# Patient Record
Sex: Male | Born: 1935 | Race: White | Hispanic: No | Marital: Married | Smoking: Former smoker
Health system: Southern US, Community
[De-identification: ages and names within clinical notes are randomized; demographics above are authoritative.]

## PROBLEM LIST (undated history)

## (undated) DIAGNOSIS — I2510 Atherosclerotic heart disease of native coronary artery without angina pectoris: Secondary | ICD-10-CM

## (undated) DIAGNOSIS — N289 Disorder of kidney and ureter, unspecified: Secondary | ICD-10-CM

## (undated) DIAGNOSIS — F0390 Unspecified dementia without behavioral disturbance: Secondary | ICD-10-CM

## (undated) DIAGNOSIS — E785 Hyperlipidemia, unspecified: Secondary | ICD-10-CM

## (undated) DIAGNOSIS — I10 Essential (primary) hypertension: Secondary | ICD-10-CM

## (undated) DIAGNOSIS — E119 Type 2 diabetes mellitus without complications: Secondary | ICD-10-CM

---

## 2019-08-12 ENCOUNTER — Emergency Department: Payer: Medicare Other

## 2019-08-12 ENCOUNTER — Emergency Department
Admission: EM | Admit: 2019-08-12 | Discharge: 2019-08-12 | Disposition: A | Discharge status: Skilled Nursing Facility | Payer: Medicare Other | Attending: Emergency Medicine | Admitting: Emergency Medicine

## 2019-08-12 ENCOUNTER — Other Ambulatory Visit: Payer: Self-pay

## 2019-08-12 DIAGNOSIS — N390 Urinary tract infection, site not specified: Secondary | ICD-10-CM | POA: Diagnosis not present

## 2019-08-12 DIAGNOSIS — E876 Hypokalemia: Secondary | ICD-10-CM | POA: Diagnosis not present

## 2019-08-12 DIAGNOSIS — I509 Heart failure, unspecified: Secondary | ICD-10-CM | POA: Insufficient documentation

## 2019-08-12 DIAGNOSIS — R404 Transient alteration of awareness: Secondary | ICD-10-CM | POA: Diagnosis not present

## 2019-08-12 DIAGNOSIS — E1122 Type 2 diabetes mellitus with diabetic chronic kidney disease: Secondary | ICD-10-CM | POA: Insufficient documentation

## 2019-08-12 DIAGNOSIS — G309 Alzheimer's disease, unspecified: Secondary | ICD-10-CM | POA: Insufficient documentation

## 2019-08-12 DIAGNOSIS — Z7984 Long term (current) use of oral hypoglycemic drugs: Secondary | ICD-10-CM | POA: Insufficient documentation

## 2019-08-12 DIAGNOSIS — I130 Hypertensive heart and chronic kidney disease with heart failure and stage 1 through stage 4 chronic kidney disease, or unspecified chronic kidney disease: Secondary | ICD-10-CM | POA: Diagnosis not present

## 2019-08-12 DIAGNOSIS — N189 Chronic kidney disease, unspecified: Secondary | ICD-10-CM | POA: Insufficient documentation

## 2019-08-12 DIAGNOSIS — I2510 Atherosclerotic heart disease of native coronary artery without angina pectoris: Secondary | ICD-10-CM | POA: Insufficient documentation

## 2019-08-12 DIAGNOSIS — R4182 Altered mental status, unspecified: Secondary | ICD-10-CM | POA: Diagnosis present

## 2019-08-12 HISTORY — DX: Unspecified dementia, unspecified severity, without behavioral disturbance, psychotic disturbance, mood disturbance, and anxiety: F03.90

## 2019-08-12 HISTORY — DX: Hyperlipidemia, unspecified: E78.5

## 2019-08-12 HISTORY — DX: Essential (primary) hypertension: I10

## 2019-08-12 HISTORY — DX: Atherosclerotic heart disease of native coronary artery without angina pectoris: I25.10

## 2019-08-12 HISTORY — DX: Type 2 diabetes mellitus without complications: E11.9

## 2019-08-12 HISTORY — DX: Disorder of kidney and ureter, unspecified: N28.9

## 2019-08-12 LAB — CBC WITH DIFFERENTIAL/PLATELET
Abs Immature Granulocytes: 0.05 10*3/uL (ref 0.00–0.07)
Basophils Absolute: 0.1 10*3/uL (ref 0.0–0.1)
Basophils Relative: 1 %
Eosinophils Absolute: 0.3 10*3/uL (ref 0.0–0.5)
Eosinophils Relative: 3 %
HCT: 39.8 % (ref 39.0–52.0)
Hemoglobin: 13.2 g/dL (ref 13.0–17.0)
Immature Granulocytes: 1 %
Lymphocytes Relative: 13 %
Lymphs Abs: 1.4 10*3/uL (ref 0.7–4.0)
MCH: 30.8 pg (ref 26.0–34.0)
MCHC: 33.2 g/dL (ref 30.0–36.0)
MCV: 92.8 fL (ref 80.0–100.0)
Monocytes Absolute: 1.1 10*3/uL — ABNORMAL HIGH (ref 0.1–1.0)
Monocytes Relative: 10 %
Neutro Abs: 8 10*3/uL — ABNORMAL HIGH (ref 1.7–7.7)
Neutrophils Relative %: 72 %
Platelets: 295 10*3/uL (ref 150–400)
RBC: 4.29 MIL/uL (ref 4.22–5.81)
RDW: 13.2 % (ref 11.5–15.5)
WBC: 10.8 10*3/uL — ABNORMAL HIGH (ref 4.0–10.5)
nRBC: 0 % (ref 0.0–0.2)

## 2019-08-12 LAB — TROPONIN I (HIGH SENSITIVITY)
Troponin I (High Sensitivity): 14 ng/L (ref <18)
Troponin I (High Sensitivity): 14 ng/L (ref <18)

## 2019-08-12 LAB — URINALYSIS, ROUTINE W REFLEX MICROSCOPIC
Bilirubin Urine: NEGATIVE
Glucose, UA: 50 mg/dL — AB
Hgb urine dipstick: NEGATIVE
Ketones, ur: NEGATIVE mg/dL
Nitrite: NEGATIVE
Protein, ur: 100 mg/dL — AB
Specific Gravity, Urine: 1.015 (ref 1.005–1.030)
pH: 5 (ref 5.0–8.0)

## 2019-08-12 LAB — COMPREHENSIVE METABOLIC PANEL
ALT: 19 U/L (ref 0–44)
AST: 26 U/L (ref 15–41)
Albumin: 3.9 g/dL (ref 3.5–5.0)
Alkaline Phosphatase: 45 U/L (ref 38–126)
Anion gap: 13 (ref 5–15)
BUN: 28 mg/dL — ABNORMAL HIGH (ref 8–23)
CO2: 21 mmol/L — ABNORMAL LOW (ref 22–32)
Calcium: 9.5 mg/dL (ref 8.9–10.3)
Chloride: 104 mmol/L (ref 98–111)
Creatinine, Ser: 1.71 mg/dL — ABNORMAL HIGH (ref 0.61–1.24)
GFR calc Af Amer: 40 mL/min — ABNORMAL LOW (ref >60)
GFR calc non Af Amer: 34 mL/min — ABNORMAL LOW (ref >60)
Glucose, Bld: 197 mg/dL — ABNORMAL HIGH (ref 70–99)
Potassium: 3.3 mmol/L — ABNORMAL LOW (ref 3.5–5.1)
Sodium: 138 mmol/L (ref 135–145)
Total Bilirubin: 0.6 mg/dL (ref 0.3–1.2)
Total Protein: 7.5 g/dL (ref 6.5–8.1)

## 2019-08-12 LAB — BRAIN NATRIURETIC PEPTIDE: B Natriuretic Peptide: 324 pg/mL — ABNORMAL HIGH (ref 0.0–100.0)

## 2019-08-12 MED ORDER — POTASSIUM CHLORIDE CRYS ER 20 MEQ PO TBCR
40.0000 meq | EXTENDED_RELEASE_TABLET | Freq: Once | ORAL | Status: AC
  Administered 2019-08-12: 40 meq via ORAL
  Filled 2019-08-12: qty 2

## 2019-08-12 MED ORDER — CIPROFLOXACIN HCL 500 MG PO TABS
500.0000 mg | ORAL_TABLET | Freq: Once | ORAL | Status: AC
  Administered 2019-08-12: 500 mg via ORAL
  Filled 2019-08-12: qty 1

## 2019-08-12 MED ORDER — CIPROFLOXACIN HCL 250 MG PO TABS: 250.0000 mg | ORAL_TABLET | Freq: Two times a day (BID) | ORAL | 0 refills | Status: DC

## 2019-08-12 NOTE — ED Provider Notes (Signed)
[facility] Emergency Department Provider Note  ____________________________________________   First MD Initiated Contact with Patient 08/12/19 1211     (approximate)  I have reviewed the triage vital signs and the nursing notes.   HISTORY  Chief Complaint Altered Mental Status    HPI Jimmy Shepard is a 86 y.o. male with Alzheimer's, CHF, diabetes, CKD who comes in for possible syncope versus falling asleep.  Patient states that he is unsure what happened.  He states that he was talking to his son and that he either fell asleep or he think he passed out.  He states he is 86 years old so who knows what happened.  I did call patient's son who states that he had been talking to him and then he noted that he was breathing heavier and was not as responsive.  This lasted a few minutes.  The son was able to call the living facility and patient was able to be woken up and was at baseline.  Patient denies any symptoms at this time.  Denies any chest pain, shortness of breath, abdominal pain, dysuria.  He denies ever having this happen before.          Past Medical History:  Diagnosis Date  . Coronary artery disease   . Dementia ([place])   . Diabetes mellitus without complication ([place])    type 2  . Hyperlipidemia   . Hypertension   . Renal disorder     There are no problems to display for this patient.   History reviewed. No pertinent surgical history.  Prior to Admission medications   Not on File    Allergies Penicillins  No family history on file.  Social History Social History   Tobacco Use  . Smoking status: Former Smoker    Years: 2.00  . Smokeless tobacco: Never Used  Substance Use Topics  . Alcohol use: Never  . Drug use: Never      Review of Systems Constitutional: No fever/chills, positive syncope Eyes: No visual changes. ENT: No sore throat. Cardiovascular: Denies chest pain. Respiratory: Denies shortness of  breath. Gastrointestinal: No abdominal pain.  No nausea, no vomiting.  No diarrhea.  No constipation. Genitourinary: Negative for dysuria. Musculoskeletal: Negative for back pain. Skin: Negative for rash. Neurological: Negative for headaches, focal weakness or numbness. All other ROS negative ____________________________________________   PHYSICAL EXAM:  VITAL SIGNS: ED Triage Vitals  Enc Vitals Group     BP 08/12/19 1227 (!) 115/59     Pulse Rate 08/12/19 1227 61     Resp 08/12/19 1227 16     Temp 08/12/19 1227 97.9 F (36.6 C)     Temp Source 08/12/19 1227 Oral     SpO2 08/12/19 1227 98 %     Weight 08/12/19 1212 170 lb (77.1 kg)     Height 08/12/19 1212 5\' 10"  (1.778 m)     Head Circumference --      Peak Flow --      Pain Score 08/12/19 1212 0     Pain Loc --      Pain Edu? --      Excl. in [place]? --     Constitutional: Alert and oriented. Well appearing and in no acute distress. Eyes: Conjunctivae are normal. EOMI. Head: Atraumatic. Nose: No congestion/rhinnorhea. Mouth/Throat: Mucous membranes are moist.   Neck: No stridor. Trachea Midline. FROM Cardiovascular: Normal rate, regular rhythm. Grossly normal heart sounds.  Good peripheral circulation. Respiratory: Normal respiratory effort.  No retractions. Lungs CTAB. Gastrointestinal: Soft and nontender. No distention. No abdominal bruits.  Musculoskeletal: No lower extremity tenderness nor edema.  No joint effusions. Neurologic:  Normal speech and language. No gross focal neurologic deficits are appreciated.  Equal strength in his arms and legs. Skin:  Skin is warm, dry and intact. No rash noted. Psychiatric: Mood and affect are normal. Speech and behavior are normal. GU: Deferred   ____________________________________________   LABS (all labs ordered are listed, but only abnormal results are displayed)  Labs Reviewed  CBC WITH DIFFERENTIAL/PLATELET - Abnormal; Notable for the following components:      Result  Value   WBC 10.8 (*)    Neutro Abs 8.0 (*)    Monocytes Absolute 1.1 (*)    All other components within normal limits  COMPREHENSIVE METABOLIC PANEL - Abnormal; Notable for the following components:   Potassium 3.3 (*)    CO2 21 (*)    Glucose, Bld 197 (*)    BUN 28 (*)    Creatinine, Ser 1.71 (*)    GFR calc non Af Amer 34 (*)    GFR calc Af Amer 40 (*)    All other components within normal limits  BRAIN NATRIURETIC PEPTIDE - Abnormal; Notable for the following components:   B Natriuretic Peptide 324.0 (*)    All other components within normal limits  URINALYSIS, ROUTINE W REFLEX MICROSCOPIC  TROPONIN I (HIGH SENSITIVITY)  TROPONIN I (HIGH SENSITIVITY)   ____________________________________________   ED ECG REPORT I, Vanessa Hemphill, the attending physician, personally viewed and interpreted this ECG.  EKG shows sinus with a first-degree AV block with left bundle branch block, negative scar Bosa criteria, T wave inversion in aVL.  No prior EKG to compare to.  Looking at prior health records although I cannot find an old EKG it does look like he has a history of left bundle branch block. ____________________________________________  RADIOLOGY I, Vanessa [place], personally viewed and evaluated these images (plain radiographs) as part of my medical decision making, as well as reviewing the written report by the radiologist.  ED MD interpretation: Chest x-ray no pneumonia  Official radiology report(s): CT Head Wo Contrast  Result Date: 08/12/2019 CLINICAL DATA:  Headache EXAM: CT HEAD WITHOUT CONTRAST TECHNIQUE: Contiguous axial images were obtained from the base of the skull through the vertex without intravenous contrast. COMPARISON:  None. FINDINGS: Brain: Moderate cerebral atrophy with ventricular enlargement. Moderate chronic microvascular ischemic changes in the white matter. Negative for acute infarct, hemorrhage, mass. Vascular: Negative for hyperdense vessel. Atherosclerotic  calcification in the carotid and vertebral arteries. Skull: Negative Sinuses/Orbits: Negative Other: None IMPRESSION: Moderate atrophy and moderate chronic microvascular ischemic changes. No acute abnormality. Electronically Signed   By: Franchot Gallo M.D.   On: 08/12/2019 12:49   DG Chest Portable 1 View  Result Date: 08/12/2019 CLINICAL DATA:  Syncope EXAM: PORTABLE CHEST 1 VIEW COMPARISON:  None. FINDINGS: The heart size and mediastinal contours are within normal limits. Both lungs are clear. The visualized skeletal structures are unremarkable. IMPRESSION: No active disease. Electronically Signed   By: Kathreen Devoid   On: 08/12/2019 13:08    ____________________________________________   PROCEDURES  Procedure(s) performed (including Critical Care):  Procedures   ____________________________________________   INITIAL IMPRESSION / ASSESSMENT AND PLAN / ED COURSE  Jimmy Shepard was evaluated in Emergency Department on 08/12/2019 for the symptoms described in the history of present illness. He was evaluated in the context of the global COVID-19 pandemic, which  necessitated consideration that the patient might be at risk for infection with the SARS-CoV-2 virus that causes COVID-19. Institutional protocols and algorithms that pertain to the evaluation of patients at risk for COVID-19 are in a state of rapid change based on information released by regulatory bodies including the CDC and federal and state organizations. These policies and algorithms were followed during the patient's care in the ED.    Patient is a 86 year old with dementia who comes in with possible syncope.  I did discuss with patient's son given patient is at baseline if you like to proceed with work-up to evaluate for reversible conditions.  Patient was interested in a further work-up to make sure that everything looked okay.  Will get labs evaluate for electrolyte abnormalities, AKI.  Will get CT head evaluate for  intracranial hemorrhage.  Will get EKG and cardiac markers to evaluate for ACS versus arrhythmia.  Will get urine to evaluate for UTI.  Consider PE but have low suspicion given patient's not hypoxic.  Heart rates are normal.  Also patient would probably not tolerate anticoagulation well given his dementia and increased risk of falling. No Abdominal pain to suggest AAA rupture.   White count was slightly elevated 10.8.  Will get UA but no obvious infectious symptoms at this time.  Labs show creatinine of 1.7 but he has a history of CKD so I suspect that this is around his baseline.  On 04/17/2018 it was 1.64.  BNP slightly elevated but his chest x-ray without evidence of fluid overload.  Potassium was slightly low at 3.3.  Will give some oral repletion.  CT head was negative.  Chest x-ray was negative.  Discussed with patient's son about the above.  We discussed that this could be syncope.  We discussed that syncope workups can be admitted to the hospital for echo, cardiac monitoring but can depends on the goals of care for patient.  They felt comfortable with patient going home and following outpatient as needed for echo if they decide to further pursue that.  Do not want further aggressive care at this time.  Patient handed off to Dr. Jimmye Norman.  If repeat cardiac marker is stable there is a low chance of an arrhythmia and family would for prefer patient to go home and follow-up as needed    ____________________________________________   FINAL CLINICAL IMPRESSION(S) / ED DIAGNOSES   Final diagnoses:  Transient alteration of awareness      MEDICATIONS GIVEN DURING THIS VISIT:  Medications  potassium chloride SA (KLOR-CON) CR tablet 40 mEq (has no administration in time range)     ED Discharge Orders    None       Note:  This document was prepared using Dragon voice recognition software and may include unintentional dictation errors.   Vanessa [place], MD 08/12/19 1520

## 2019-08-12 NOTE — ED Notes (Signed)
Patient redirected to stay in bed. Patient continues to get up and try to walk down hallway. Patient given bench to sit on

## 2019-08-12 NOTE — ED Notes (Signed)
Granddaughter Abby given update and states she would be able to pick up pt

## 2019-08-12 NOTE — ED Notes (Signed)
Pt taken to CT.

## 2019-08-12 NOTE — ED Provider Notes (Signed)
Patient has evidence of urinary tract infection, will be treated with Cipro.  Otherwise work-up has been unremarkable.   Earleen Newport, MD 08/12/19 1540

## 2019-08-12 NOTE — ED Notes (Signed)
Pt is poor historian- son to come sit with pt

## 2019-08-12 NOTE — Discharge Instructions (Addendum)
Patient presented for possible loss of consciousness.  His CT scan was reassuring.  Chest x-ray was negative.  Labs are reassuring.  We discussed admission versus discharge home.  Family felt comfortable with being discharged and can follow-up outpatient for echocardiogram with her cardiologist as needed.  Return to the ER for any other concerns.

## 2019-08-12 NOTE — ED Triage Notes (Signed)
Pt arrives via EMS from [facility] after having an episode where pt was unresponsive with heavy breathing and drooling - staff tried to wake him up and he was easily aroused- pt has no complaints

## 2019-08-12 NOTE — ED Notes (Signed)
Pt unable to sign for discharge d/t dementia- pt son and pt granddaughter verbalized understanding of discharge

## 2019-10-02 NOTE — Progress Notes (Signed)
.   10/03/19 7:50 PM   Jimmy Shepard 1926-07-07 [id number]  Referring provider: Dion Body, MD [facility] [place],  Jimmy Shepard [id number]  Chief Complaint  Patient presents with  . history of bladder cancer  . history of uti    HPI: Jimmy Shepard is a 86 y.o. white M with hx of alzheimers and bladder cancer presents today for the evaluation and management of rUTIs. He was referred to [place] by Jimmy Body, MD. Pt is accompanied by daughter in law in person and her husband via phone.    He has a personal history of bladder cancer previously seen and evaluated at [facility] and more recently 2 symptomatic UTIs.  He has a personal history of High grade T1 bladder cancer diagnosed on April 2019 for which he underwent TURBT and ultimately 6 weeks of Gemzar.   CT May 2019, showed cirumferential bladder thickening with surrounding vesciular haziness.   He returned to the operating room on November 2019 for cystocopy bladder bx with bilateral retrograde which indicated a 3 cm bladder tumor at do me and no upper tract abnormalities.  He did receive postoperative mitomycin.  He had an ED visit on 08/12/19 for UTI which was treated with Cipro. UA was taken during this visit which indicated  50 glucose, 100 protein, small leukocytes, and few bacteria.  Surgical pathology on this occasion was consistent with high-grade noninvasive tumor.  No muscularis was present.  Ultimately, after this particular surgery, the family elected not to pursue any further intervention in light of his overall experience, morbidity and confounded by global pandemic and travel restriction.  More recently, he has been struggling with recurrent urinary tract infections.  He has had 2 documented infections in the recent past.  His primary symptom was significant confusion.  He does have baseline Alzheimer's  His UA on 08/30/19 done by PCP indicated 100 protein, 100 glucose, trace blood,  moderate leukocyte, >50 WBC and many bacteria. Associated urine culture was positive for E. Faecalis resistant to cipro, levofloxacin, and tetracycline. UA positive x2 for UTI in 2021.   His daughter reports that his assisted living facility would not let residents leave unless emergency so has not seen a physician in the year of 2020.   Denies gross hematuria, weight loss, pain or aches in Shepard. He reports of being physically fit and active.    PVR today 0 mL.  He is a former remote minimal smoker.  Most of the history today was provided both by the electronic medical record, Care Everywhere but also the patient's daughter-in-law and son.  The patient is a limited historian.  PMH: Past Medical History:  Diagnosis Date  . Coronary artery disease   . Dementia ([place])   . Diabetes mellitus without complication ([place])    type 2  . Hyperlipidemia   . Hypertension   . Renal disorder     Surgical History: No past surgical history on file.  Home Medications:  Allergies as of 10/03/2019      Reactions   Tetanus Toxoids Other (See Comments)   Other reaction(s): Fever (intolerance), Unknown   Penicillins       Medication List       Accurate as of October 03, 2019  7:50 PM. If you have any questions, ask your nurse or doctor.        STOP taking these medications   ciprofloxacin 250 MG tablet Commonly known as: Cipro Stopped by: Hollice Espy, MD  TAKE these medications   Acetaminophen 325 MG Caps Take by mouth.   aliskiren 300 MG tablet Commonly known as: TEKTURNA TAKE ONE TABLET BY MOUTH ONCE DAILY   aspirin 81 MG chewable tablet Chew by mouth.   Assure Pro Blood Glucose Meter Devi Check blood sugar once daily as needed. DX: E11.9   calcitRIOL 0.25 MCG capsule Commonly known as: ROCALTROL Take by mouth.   donepezil 10 MG tablet Commonly known as: ARICEPT Take by mouth.   glipiZIDE 2.5 MG 24 hr tablet Commonly known as: GLUCOTROL XL Take by mouth.     isosorbide mononitrate 30 MG 24 hr tablet Commonly known as: IMDUR Take by mouth.   losartan 50 MG tablet Commonly known as: COZAAR Take by mouth.   metoprolol succinate 100 MG 24 hr tablet Commonly known as: TOPROL-XL Take by mouth.   NIFEdipine 60 MG 24 hr tablet Commonly known as: ADALAT CC Take by mouth.   simvastatin 40 MG tablet Commonly known as: ZOCOR Take by mouth.       Allergies:  Allergies  Allergen Reactions  . Tetanus Toxoids Other (See Comments)    Other reaction(s): Fever (intolerance), Unknown  . Penicillins     Family History: No family history on file.  Social History:  reports that he has quit smoking. He quit after 2.00 years of use. He has never used smokeless tobacco. He reports that he does not drink alcohol or use drugs.   Physical Exam: BP (!) 166/77   Pulse 73   Ht 5\' 10"  (1.778 m)   Wt 195 lb (88.5 kg)   BMI 27.98 kg/m   Constitutional:  Alert and oriented, No acute distress.  Pleasant, interactive, answers most questions appropriately but repetitive.  In wheelchair. HEENT: Jimmy Shepard AT, moist mucus membranes.  Trachea midline, no masses. Cardiovascular: No clubbing, cyanosis, or edema. Respiratory: Normal respiratory effort, no increased work of breathing. Skin: No rashes, bruises or suspicious lesions. Neurologic: Grossly intact, no focal deficits, moving all 4 extremities. Psychiatric: Normal mood and affect.  Laboratory Data:  Urinalysis UA today unremarkable.   Pertinent Imaging: Results for orders placed or performed in visit on 10/03/19  BLADDER SCAN AMB NON-IMAGING  Result Value Ref Range   Scan Result 0 ML    Assessment & Plan:    1. Bladder cancer Last addressed in 2019 for cystoscopy bladder bx with bilateral retrograde which indicated a 3 cm bladder tumor at dome and no upper tract abnormalities.   Educated pt's family on possible metastasis of tumor causing pain and discomfort to pt in future without further  intervention should the tumor progress.  Risk and benefits of further surveillance and treatment were discussed at length today.  Discussed palliative care with pt's family along with option of cystoscopy with associated risk and benefits   Pt's family elected to have a cystoscopy done to have a better idea to make a more informed decision  Cysto in 1 week   2. rUTI May be secondary to above  DDx tumor reoccurrence vs stones vs foreign Shepard  Adequate emptying of bladder  UA today negative   F/u 1 week today for cysto  [facility] [address], [place], [place] [address] [phone]  I, Lucas Mallow, am acting as a scribe for Dr. Hollice Espy,  I have reviewed the above documentation for accuracy and completeness, and I agree with the above.   Hollice Espy, MD  I spent 60 total minutes on the  day of the encounter including pre-visit review of the medical record, face-to-face time with the patient, and post visit ordering of labs/imaging/tests.  Discussion today as well as chart review was extensive.

## 2019-10-03 ENCOUNTER — Ambulatory Visit (INDEPENDENT_AMBULATORY_CARE_PROVIDER_SITE_OTHER): Payer: Medicare Other | Admitting: Urology

## 2019-10-03 ENCOUNTER — Other Ambulatory Visit: Payer: Self-pay

## 2019-10-03 VITALS — BP 166/77 | HR 73 | Ht 70.0 in | Wt 195.0 lb

## 2019-10-03 DIAGNOSIS — C679 Malignant neoplasm of bladder, unspecified: Secondary | ICD-10-CM | POA: Diagnosis not present

## 2019-10-03 LAB — BLADDER SCAN AMB NON-IMAGING: Scan Result: 0

## 2019-10-03 NOTE — Patient Instructions (Signed)

## 2019-10-04 LAB — URINALYSIS, COMPLETE
Bilirubin, UA: NEGATIVE
Ketones, UA: NEGATIVE
Leukocytes,UA: NEGATIVE
Nitrite, UA: NEGATIVE
Specific Gravity, UA: 1.025 (ref 1.005–1.030)
Urobilinogen, Ur: 0.2 mg/dL (ref 0.2–1.0)
pH, UA: 7 (ref 5.0–7.5)

## 2019-10-04 LAB — MICROSCOPIC EXAMINATION: Bacteria, UA: NONE SEEN

## 2019-10-08 NOTE — Progress Notes (Signed)
   10/09/19  CC:  Chief Complaint  Patient presents with  . Cysto    HPI: Jimmy Shepard is a 86 y.o. white M with hx of alzheimers and bladder cancer presents today for the evaluation and management of rUTIs. He was referred to [place] by Dion Body, MD.   See previous note for details.   He reports of not pulling the foreskin back and cleaning properly.   Today's Vitals   10/09/19 1330  BP: (!) 144/64  Pulse: 70  Weight: 195 lb (88.5 kg)  Height: 5\' 10"  (1.778 m)   Body mass index is 27.98 kg/m. NED. A&Ox3.   No respiratory distress   Abd soft, NT, ND Normal phallus with bilateral descended testicles  Cystoscopy Procedure Note  Patient identification was confirmed, informed consent was obtained, and patient was prepped using Betadine solution.  Lidocaine jelly was administered per urethral meatus.  Pt is accompanied by daughter in law.    Pre-Procedure: - Inspection reveals a normal caliber ureteral meatus.  Procedure: The flexible cystoscope was introduced without difficulty - No urethral strictures/lesions are present. - Normal prostate  - Bilateral ureteral orifices identified - Bladder mucosa  reveals no ulcers, tumors, or lesions - No bladder stones - Enlarged prostate with severe bladder trabeculation and saccules  Retroflexion shows no reoccurrence of tumor with large median lobe  Post-Procedure: - Patient tolerated the procedure well  Physical Exam:  GU: Phimosis with inflammation/ phimosis and narrow meatus    Assessment/ Plan:  1. Bladder cancer No evidence of diesease  2. Phimosis/balanophtothesis   Possible/ likely contributing factor to recent infections  Pt may benefit from circumcision vs. Dorsal slit,  More conservative approach with Mycolog steroid /antifungal cream also discussed Encouraged to have a daily shower and pull foreskin back to clean properly and changing depends regularly Care instructions outlined and sent back to  memory care facility Discussed with daughter in law at length, will let us know if would like to pursue office based procedure  3. rUTI May be secondary to above   Return in about 6 weeks (around 11/20/2019) for penis recheck.  Jamas Lav, am acting as a scribe for Dr. Hollice Espy,  I have reviewed the above documentation for accuracy and completeness, and I agree with the above.   Hollice Espy, MD

## 2019-10-09 ENCOUNTER — Ambulatory Visit (INDEPENDENT_AMBULATORY_CARE_PROVIDER_SITE_OTHER): Payer: Medicare Other | Admitting: Urology

## 2019-10-09 ENCOUNTER — Encounter: Payer: Self-pay | Admitting: Urology

## 2019-10-09 ENCOUNTER — Other Ambulatory Visit: Payer: Self-pay

## 2019-10-09 VITALS — BP 144/64 | HR 70 | Ht 70.0 in | Wt 195.0 lb

## 2019-10-09 DIAGNOSIS — C679 Malignant neoplasm of bladder, unspecified: Secondary | ICD-10-CM | POA: Diagnosis not present

## 2019-10-09 LAB — URINALYSIS, COMPLETE
Bilirubin, UA: NEGATIVE
Ketones, UA: NEGATIVE
Leukocytes,UA: NEGATIVE
Nitrite, UA: NEGATIVE
RBC, UA: NEGATIVE
Specific Gravity, UA: 1.025 (ref 1.005–1.030)
Urobilinogen, Ur: 0.2 mg/dL (ref 0.2–1.0)
pH, UA: 6.5 (ref 5.0–7.5)

## 2019-10-09 LAB — MICROSCOPIC EXAMINATION
Bacteria, UA: NONE SEEN
RBC, Urine: NONE SEEN /hpf (ref 0–2)

## 2019-10-09 MED ORDER — CIPROFLOXACIN HCL 500 MG PO TABS
500.0000 mg | ORAL_TABLET | Freq: Once | ORAL | Status: AC
  Administered 2019-10-09: 500 mg via ORAL

## 2019-10-09 MED ORDER — NYSTATIN-TRIAMCINOLONE 100000-0.1 UNIT/GM-% EX OINT: 1.0000 "application " | TOPICAL_OINTMENT | Freq: Two times a day (BID) | CUTANEOUS | 0 refills | Status: DC

## 2019-10-09 NOTE — Patient Instructions (Signed)
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  Recommend the following:  1.  Please have patient shower daily  2.  Please instruct patient to pull back his foreskin and clean his penis with soap and water daily.  3.  Please have patient apply antifungal/steroid cream to foreskin and head of penis twice daily  4.  Please have patient change depends at least twice daily

## 2019-11-19 NOTE — Progress Notes (Signed)
11/20/19 9:09 AM   Jimmy Shepard 1926/02/11 [id number]  Referring provider: Dion Body, MD [facility] [place],  [place] [address] Chief Complaint  Patient presents with  . Follow-up    HPI: Jimmy Shepard is a 86 y.o. M with hx of alzheimers and bladder cancer returns today for reevaluation.  He has a personal history of bladder cancer previously seen and evaluated at [facility] and more recently 2 symptomatic UTIs.  He has a personal history of High grade T1 bladder cancer diagnosed on April 2019 for which he underwent TURBT and ultimately 6 weeks of Gemzar.   CT May 2019, showed cirumferential bladder thickening with surrounding vesciular haziness.  He returned to the operating room on November 2019 for cystocopy bladder bx with bilateral retrograde which indicated a 3 cm bladder tumor at do me and no upper tract abnormalities.  He did receive postoperative mitomycin.  He had an ED visit on 08/12/19 for UTI which was treated with Cipro. UA was taken during this visit which indicated  50 glucose, 100 protein, small leukocytes, and few bacteria.  Surgical pathology on this occasion was consistent with high-grade noninvasive tumor.  No muscularis was present.  Ultimately, after this particular surgery, the family elected not to pursue any further intervention in light of his overall experience, morbidity and confounded by global pandemic and travel restriction.  His UA on 08/30/19 done by PCP indicated 100 protein, 100 glucose, trace blood, moderate leukocyte, >50 WBC and many bacteria. Associated urine culture was positive for E. Faecalis resistant to cipro, levofloxacin, and tetracycline. UA positive x2 for UTI in 2021.   On previous cysto visit, phimosis/balanophtothesis was noted for possible contributing factor to recent infections. He was advised to use Mycolog steroid w/ daily showers including pulling foreskin back for proper cleaning.   He has  been using the Mycolog steroid cream everyday w/ no complications.   He is a former remote minimal smoker.  PMH: Past Medical History:  Diagnosis Date  . Coronary artery disease   . Dementia ([place])   . Diabetes mellitus without complication ([place])    type 2  . Hyperlipidemia   . Hypertension   . Renal disorder     Surgical History: No past surgical history on file.  Home Medications:  Allergies as of 11/20/2019      Reactions   Tetanus Toxoids Other (See Comments)   Other reaction(s): Fever (intolerance), Unknown   Penicillins       Medication List       Accurate as of Nov 20, 2019 11:59 PM. If you have any questions, ask your nurse or doctor.        Acetaminophen 325 MG Caps Take by mouth.   aliskiren 300 MG tablet Commonly known as: TEKTURNA TAKE ONE TABLET BY MOUTH ONCE DAILY   aspirin 81 MG chewable tablet Chew by mouth.   Assure Pro Blood Glucose Meter Devi Check blood sugar once daily as needed. DX: E11.9   calcitRIOL 0.25 MCG capsule Commonly known as: ROCALTROL Take by mouth.   donepezil 10 MG tablet Commonly known as: ARICEPT Take by mouth.   glipiZIDE 2.5 MG 24 hr tablet Commonly known as: GLUCOTROL XL Take by mouth.   isosorbide mononitrate 30 MG 24 hr tablet Commonly known as: IMDUR Take by mouth.   losartan 50 MG tablet Commonly known as: COZAAR Take by mouth.   metoprolol succinate 100 MG 24 hr tablet Commonly known as: TOPROL-XL Take by mouth.  NIFEdipine 60 MG 24 hr tablet Commonly known as: ADALAT CC Take by mouth.   nystatin-triamcinolone ointment Commonly known as: MYCOLOG Apply 1 application topically 2 (two) times daily.   simvastatin 40 MG tablet Commonly known as: ZOCOR Take by mouth.       Allergies:  Allergies  Allergen Reactions  . Tetanus Toxoids Other (See Comments)    Other reaction(s): Fever (intolerance), Unknown  . Penicillins     Family History: No family history on file.  Social History:   reports that he has quit smoking. He quit after 2.00 years of use. He has never used smokeless tobacco. He reports that he does not drink alcohol or use drugs.   Physical Exam: BP (!) 153/65   Pulse 67   Constitutional:  Alert and oriented, No acute distress.  Pleasantly demented, accompanied by adult son. HEENT: [place] AT, moist mucus membranes.  Trachea midline, no masses. Cardiovascular: No clubbing, cyanosis, or edema. Respiratory: Normal respiratory effort, no increased work of breathing. GU: No CVA tenderness. Uncircumcised. Improvement in inflammation/phimosis, able to fully retract foreskin today with resolution of erythema and marked improvement of hygiene. Skin: No rashes, bruises or suspicious lesions. Neurologic: Grossly intact, no focal deficits, moving all 4 extremities. Psychiatric: Normal mood and affect.  Laboratory Data:  Lab Results  Component Value Date   CREATININE 1.71 (H) 08/12/2019   Assessment & Plan:    1. Phimosis/balanophtothesis  Possible/likely contributing factor to recent infections Significant improvement upon use of Mycolog steroid cream and proper cleaning  Continue use of Mycolog steroid cream and daily cleaning w/ foreskin retracted  May consider circumcision vs. Dorsal slit in future if symptoms worsen   2. Bladder cancer NED  We had another conversation today with the patient's son about goals of care.  Ultimately, the patient's son has had a family meeting and they have elected that they would like to continue surveillance and possibly intervene to prevent morbidity if deemed appropriate.  We discussed that the standard of care would be q. 54-month cystoscopies given his high risk but we negotiated for every 4 month to reduce that an office visit burden and discomfort to the patient after shared decision-making conversation.  All questions answered. Discussed increased 4 month intervals w/ cysto   Return in 3 months from today for cysto     [facility] [address] [place],  [address] [phone]  I, Lucas Mallow, am acting as a scribe for Dr. Hollice Espy,  I have reviewed the above documentation for accuracy and completeness, and I agree with the above.   Hollice Espy, MD

## 2019-11-20 ENCOUNTER — Encounter: Payer: Self-pay | Admitting: Urology

## 2019-11-20 ENCOUNTER — Other Ambulatory Visit: Payer: Self-pay

## 2019-11-20 ENCOUNTER — Ambulatory Visit (INDEPENDENT_AMBULATORY_CARE_PROVIDER_SITE_OTHER): Payer: Medicare Other | Admitting: Urology

## 2019-11-20 VITALS — BP 153/65 | HR 67

## 2019-11-20 DIAGNOSIS — N471 Phimosis: Secondary | ICD-10-CM | POA: Diagnosis not present

## 2019-11-20 DIAGNOSIS — C679 Malignant neoplasm of bladder, unspecified: Secondary | ICD-10-CM | POA: Diagnosis not present

## 2020-02-19 NOTE — Progress Notes (Signed)
° °  02/20/2020  CC:  Chief Complaint  Patient presents with   Cysto    HPI: Jimmy Shepard is a 86 y.o. M with hx of alzheimers and bladder cancer returns today for cysto. He is accompanied by his son today.  He has a personal history of bladder cancer previously seen and evaluated at [facility] and more recently 2 symptomatic UTIs.  He has a personal history of High grade T1 bladder cancer diagnosed on April 2019 for which heunderwent TURBT and ultimately6 weeks of Gemzar.   CT May 2019, showed cirumferential bladder thickening with surrounding vesciular haziness.  He returnedto theoperating room on November 2019 for cystocopy bladder bx with bilateral retrograde which indicated a 3 cm bladder tumor at do me and no upper tract abnormalities. He did receive postoperative mitomycin.  On previous cysto visit, phimosis/balanophtothesis was noted for possible contributing factor to recent infections. He was advised to use Mycolog steroid w/ daily showers including pulling foreskin back for proper cleaning.   He is a former remote minimal smoker.   Blood pressure (!) 142/75, pulse 65. NED. A&Ox3.   No respiratory distress   Abd soft, NT, ND Normal phallus with bilateral descended testicles  Cystoscopy Procedure Note  Patient identification was confirmed, informed consent was obtained, and patient was prepped using Betadine solution.  Lidocaine jelly was administered per urethral meatus.     Pre-Procedure: - Inspection reveals a slight narrowing of the fossa navicularis but able to pass the scope. Foreskin is unremarkable and retractable today.  Procedure: The flexible cystoscope was introduced without difficulty - No urethral strictures/lesions are present. - Normal prostate  - Normal bladder neck - Bilateral ureteral orifices identified - Bladder mucosa  reveals no ulcers, tumors, or lesions, stellate scar appreciated posterior bladder wall. - No bladder stones - Mod/  severe trabeculation  Retroflexion is unremarkable.   Post-Procedure: - Patient tolerated the procedure well  Assessment/ Plan:  1. Phimosis/balanophtothesis  Significant improvement upon use of Mycolog steroid cream and proper cleaning  May discontinue Mycolog for the time being, resume as needed  2. Bladder cancer  Cysto is unremarkable No evidence of disease Patient is high risk and given its been over 2 year since recurrence I discussed standard of care would be every 6 month cystoscopies.   Fransico Him, am acting as a scribe for Dr. Hollice Espy.  I have reviewed the above documentation for accuracy and completeness, and I agree with the above.   Hollice Espy, MD

## 2020-02-20 ENCOUNTER — Encounter: Payer: Self-pay | Admitting: *Deleted

## 2020-02-20 ENCOUNTER — Telehealth: Payer: Self-pay | Admitting: *Deleted

## 2020-02-20 ENCOUNTER — Ambulatory Visit (INDEPENDENT_AMBULATORY_CARE_PROVIDER_SITE_OTHER): Payer: Medicare Other | Admitting: Urology

## 2020-02-20 ENCOUNTER — Other Ambulatory Visit: Payer: Self-pay

## 2020-02-20 VITALS — BP 142/75 | HR 65

## 2020-02-20 DIAGNOSIS — C679 Malignant neoplasm of bladder, unspecified: Secondary | ICD-10-CM

## 2020-02-20 NOTE — Telephone Encounter (Signed)
Received a VM regarding discontinuing Nystatin cream. OK per Dr. Erlene Quan to discontinue. Faxed letter to [phone].

## 2020-02-22 LAB — URINALYSIS, COMPLETE
Bilirubin, UA: NEGATIVE
Ketones, UA: NEGATIVE
Leukocytes,UA: NEGATIVE
Nitrite, UA: NEGATIVE
RBC, UA: NEGATIVE
Specific Gravity, UA: 1.02 (ref 1.005–1.030)
Urobilinogen, Ur: 0.2 mg/dL (ref 0.2–1.0)
pH, UA: 7 (ref 5.0–7.5)

## 2020-02-22 LAB — MICROSCOPIC EXAMINATION

## 2020-07-22 ENCOUNTER — Emergency Department: Payer: Medicare Other

## 2020-07-22 ENCOUNTER — Emergency Department
Admission: EM | Admit: 2020-07-22 | Discharge: 2020-07-22 | Disposition: A | Discharge status: Home / Self Care | Payer: Medicare Other | Attending: Emergency Medicine | Admitting: Emergency Medicine

## 2020-07-22 ENCOUNTER — Other Ambulatory Visit: Payer: Self-pay

## 2020-07-22 DIAGNOSIS — Z79899 Other long term (current) drug therapy: Secondary | ICD-10-CM | POA: Diagnosis not present

## 2020-07-22 DIAGNOSIS — I2510 Atherosclerotic heart disease of native coronary artery without angina pectoris: Secondary | ICD-10-CM | POA: Diagnosis not present

## 2020-07-22 DIAGNOSIS — I119 Hypertensive heart disease without heart failure: Secondary | ICD-10-CM | POA: Insufficient documentation

## 2020-07-22 DIAGNOSIS — S0083XA Contusion of other part of head, initial encounter: Secondary | ICD-10-CM | POA: Diagnosis not present

## 2020-07-22 DIAGNOSIS — E119 Type 2 diabetes mellitus without complications: Secondary | ICD-10-CM | POA: Diagnosis not present

## 2020-07-22 DIAGNOSIS — Z7984 Long term (current) use of oral hypoglycemic drugs: Secondary | ICD-10-CM | POA: Insufficient documentation

## 2020-07-22 DIAGNOSIS — S0990XA Unspecified injury of head, initial encounter: Secondary | ICD-10-CM | POA: Insufficient documentation

## 2020-07-22 DIAGNOSIS — Z87891 Personal history of nicotine dependence: Secondary | ICD-10-CM | POA: Insufficient documentation

## 2020-07-22 DIAGNOSIS — W19XXXA Unspecified fall, initial encounter: Secondary | ICD-10-CM | POA: Diagnosis not present

## 2020-07-22 DIAGNOSIS — F0390 Unspecified dementia without behavioral disturbance: Secondary | ICD-10-CM | POA: Insufficient documentation

## 2020-07-22 DIAGNOSIS — Z7982 Long term (current) use of aspirin: Secondary | ICD-10-CM | POA: Diagnosis not present

## 2020-07-22 MED ORDER — BACITRACIN-NEOMYCIN-POLYMYXIN 400-5-5000 EX OINT
TOPICAL_OINTMENT | CUTANEOUS | Status: AC
  Administered 2020-07-22: 1
  Filled 2020-07-22: qty 1

## 2020-07-22 MED ORDER — BACITRACIN ZINC 500 UNIT/GM EX OINT: TOPICAL_OINTMENT | Freq: Two times a day (BID) | CUTANEOUS | Status: DC

## 2020-07-22 NOTE — ED Notes (Signed)
Pt to CT. Pt in NAD at this ti,

## 2020-07-22 NOTE — ED Notes (Signed)
Report given to [facility] at [facility].

## 2020-07-22 NOTE — ED Notes (Signed)
Pt attempted top get OOB and had urinated in bed. Pt cleaned up and given urinal. Reassured that EMS coming to pick him up to bring home.

## 2020-07-22 NOTE — ED Notes (Signed)
[facility] at bedside. Head wound dressed for transport. Pt unable to sign for discharge due to dementia.  POA and home have been informed of discharge.

## 2020-07-22 NOTE — ED Notes (Signed)
Provided water, crackers, applesauce. Pt resting in bed.

## 2020-07-22 NOTE — ED Notes (Signed)
Spoke with son who is POA. He had gotten missed calls from [facility] last night. Updated him on pt condition. He will call [facility] and let them know pt will be sent back this morning via EMS.

## 2020-07-22 NOTE — ED Provider Notes (Signed)
[facility] Emergency Department Provider Note  Time seen: 4:19 AM  I have reviewed the triage vital signs and the nursing notes.   HISTORY  Chief Complaint Fall   HPI Jimmy Shepard is a 87 y.o. male with a past medical history of dementia, diabetes, hypertension, hyperlipidemia presents to the emergency department after a fall.  According to report patient fell hitting his head, was found on the ground unknown downtime.  Currently patient is awake alert, well-appearing.  Denies any complaints besides discomfort to his forehead where he has an abrasion.  Unable to provide additional history or review of systems.   Past Medical History:  Diagnosis Date  . Coronary artery disease   . Dementia ([facility])   . Diabetes mellitus without complication ([facility])    type 2  . Hyperlipidemia   . Hypertension   . Renal disorder     There are no problems to display for this patient.   History reviewed. No pertinent surgical history.  Prior to Admission medications   Medication Sig Start Date End Date Taking? Authorizing Provider  Acetaminophen 325 MG CAPS Take by mouth. Patient not taking: Reported on 02/21/2020    [provider]  aspirin 81 MG chewable tablet Chew by mouth.    [provider]  Blood Glucose Monitoring Suppl (ASSURE PRO BLOOD GLUCOSE METER) DEVI Check blood sugar once daily as needed. DX: E11.9 Patient not taking: Reported on 02/21/2020 11/23/16   [provider]  calcitRIOL (ROCALTROL) 0.25 MCG capsule Take by mouth. 07/20/17   [provider]  donepezil (ARICEPT) 10 MG tablet Take by mouth. 07/06/16   [provider]  glipiZIDE (GLUCOTROL XL) 2.5 MG 24 hr tablet Take by mouth. 10/14/17   [provider]  isosorbide mononitrate (IMDUR) 30 MG 24 hr tablet Take by mouth. 03/13/13   [provider]  losartan (COZAAR) 50 MG tablet Take by mouth. 08/30/19 08/29/20  [provider]  metoprolol  succinate (TOPROL-XL) 100 MG 24 hr tablet Take by mouth. 09/13/13   [provider]  NIFEdipine (ADALAT CC) 60 MG 24 hr tablet Take by mouth. 01/05/14   [provider]  nystatin-triamcinolone ointment (MYCOLOG) Apply 1 application topically 2 (two) times daily. 10/09/19   Vanna Scotland, MD  simvastatin (ZOCOR) 40 MG tablet Take by mouth. 01/05/14   [provider]    Allergies  Allergen Reactions  . Tetanus Toxoids Other (See Comments)    Other reaction(s): Fever (intolerance), Unknown  . Penicillins     History reviewed. No pertinent family history.  Social History Social History   Tobacco Use  . Smoking status: Former Smoker    Years: 2.00  . Smokeless tobacco: Never Used  Substance Use Topics  . Alcohol use: Never  . Drug use: Never    Review of Systems Unable to obtain adequate/accurate review of systems secondary to baseline dementia.  ____________________________________________   PHYSICAL EXAM:  VITAL SIGNS: ED Triage Vitals  Enc Vitals Group     BP 07/22/20 0343 (!) 176/61     Pulse Rate 07/22/20 0343 62     Resp 07/22/20 0343 17     Temp 07/22/20 0343 98.6 F (37 C)     Temp src --      SpO2 --      Weight 07/22/20 0341 195 lb 1.7 oz (88.5 kg)     Height 07/22/20 0341 5\' 10"  (1.778 m)     Head Circumference --  Peak Flow --      Pain Score 07/22/20 0341 0     Pain Loc --      Pain Edu? --      Excl. in [place]? --    Constitutional: Awake alert, no distress. Eyes: Normal exam ENT      Head: Abrasion to forehead and right cheek      Mouth/Throat: Mucous membranes are moist. Cardiovascular: Normal rate, regular rhythm. No murmur Respiratory: Normal respiratory effort without tachypnea nor retractions. Breath sounds are clear  Gastrointestinal: Soft and nontender. No distention.  Musculoskeletal: Nontender with normal range of motion in all extremities.  Neurologic:  Normal speech and language. No gross focal neurologic  deficits  Skin: Abrasion to forehead Psychiatric: Mood and affect are normal.   ____________________________________________    EKG  EKG viewed and interpreted by myself shows a junctional rhythm at 59 bpm with a widened QRS, left axis deviation, largely normal intervals otherwise with nonspecific ST changes  ____________________________________________   INITIAL IMPRESSION / ASSESSMENT AND PLAN / ED COURSE  Pertinent labs & imaging results that were available during my care of the patient were reviewed by me and considered in my medical decision making (see chart for details).   Patient presents to the emergency department for a fall from his nursing facility.  Overall the patient appears well, reassuring physical exam.  Great range of motion in all extremities with no tenderness elicited.  Abrasions to forehead without laceration.  We will obtain CT imaging of the head and neck as a precaution.  If negative anticipate discharge back to his nursing facility.  CT scans are negative for acute abnormality.  Patient will be discharged back to his nursing facility.  Jimmy Shepard was evaluated in Emergency Department on 07/22/2020 for the symptoms described in the history of present illness. He was evaluated in the context of the global COVID-19 pandemic, which necessitated consideration that the patient might be at risk for infection with the SARS-CoV-2 virus that causes COVID-19. Institutional protocols and algorithms that pertain to the evaluation of patients at risk for COVID-19 are in a state of rapid change based on information released by regulatory bodies including the CDC and federal and state organizations. These policies and algorithms were followed during the patient's care in the ED.  ____________________________________________   FINAL CLINICAL IMPRESSION(S) / ED DIAGNOSES  Fall Head injury   Harvest Dark, MD 07/22/20 [phone]

## 2020-07-22 NOTE — ED Triage Notes (Signed)
Pt BIBA from [facility] for an unwitnessed fall. Unknown down time. Pt has a hematoma to forehead. EMS reports pt has a hx of dementia. Pt A&O x 1. Pt denies any pain. EMS reports pt takes ASA but no other known blood thinners.

## 2020-08-26 ENCOUNTER — Other Ambulatory Visit: Payer: Self-pay

## 2020-08-26 ENCOUNTER — Other Ambulatory Visit: Payer: Self-pay | Admitting: Urology

## 2020-08-26 ENCOUNTER — Encounter: Payer: Self-pay | Admitting: Urology

## 2020-08-26 ENCOUNTER — Ambulatory Visit (INDEPENDENT_AMBULATORY_CARE_PROVIDER_SITE_OTHER): Payer: Medicare Other | Admitting: Urology

## 2020-08-26 VITALS — BP 162/75 | HR 61

## 2020-08-26 DIAGNOSIS — C679 Malignant neoplasm of bladder, unspecified: Secondary | ICD-10-CM | POA: Diagnosis not present

## 2020-08-26 LAB — URINALYSIS, COMPLETE
Bilirubin, UA: NEGATIVE
Ketones, UA: NEGATIVE
Nitrite, UA: NEGATIVE
Specific Gravity, UA: 1.02 (ref 1.005–1.030)
Urobilinogen, Ur: 0.2 mg/dL (ref 0.2–1.0)
pH, UA: 7 (ref 5.0–7.5)

## 2020-08-26 LAB — MICROSCOPIC EXAMINATION: WBC, UA: >30 /hpf — AB (ref 0–5)

## 2020-08-26 MED ORDER — SULFAMETHOXAZOLE-TRIMETHOPRIM 800-160 MG PO TABS
1.0000 | ORAL_TABLET | Freq: Once | ORAL | Status: AC
  Administered 2020-08-26: 1 via ORAL

## 2020-08-26 NOTE — Progress Notes (Signed)
   02/20/2020  CC:  Chief Complaint  Patient presents with  . Cysto    HPI: Jimmy Shepard is a 87 y.o. M with hx of alzheimers and bladder cancer returns today for cysto. He is accompanied by his son today.  He has a personal history of High grade T1 bladder cancer diagnosed on April 2019 for which heunderwent TURBT and ultimately6 weeks of Gemzar.   CT May 2019, showed cirumferential bladder thickening with surrounding vesciular haziness.  He returnedto theoperating room on November 2019 for cystocopy bladder bx with bilateral retrograde which indicated a 3 cm bladder tumor at do me and no upper tract abnormalities. He did receive postoperative mitomycin.  On previous cysto visit, phimosis/balanophtothesis was noted for possible contributing factor to recent infections. He was advised to use Mycolog steroid w/ daily showers including pulling foreskin back for proper cleaning.   His daughter-in-law presents with him today.  She reports that his facility is indicating several more episodes of incontinence and wetting his diaper.  He does not seem to be bothered by this.  He is a former remote minimal smoker.   Blood pressure (!) 162/75, pulse 61. NED. A&Ox3.   No respiratory distress   Abd soft, NT, ND Normal phallus with bilateral descended testicles  Cystoscopy Procedure Note  Patient identification was confirmed, informed consent was obtained, and patient was prepped using Betadine solution.  Lidocaine jelly was administered per urethral meatus.     Pre-Procedure: - Inspection reveals a slight narrowing of the fossa navicularis but able to pass the scope. Foreskin is unremarkable and retractable today.  Procedure: The flexible cystoscope was introduced without difficulty - No urethral strictures/lesions are present. - Normal prostate  - Normal bladder neck - Bilateral ureteral orifices identified - Bladder mucosa  reveals no ulcers, tumors, or lesions, stellate  scar appreciated posterior bladder wall. - No bladder stones - Mod/ severe trabeculation -Mild diffuse debris  Retroflexion is unremarkable.   Post-Procedure: - Patient tolerated the procedure well  Assessment/ Plan:  1. Phimosis/balanophtothesis /worsening urinary incontinence Resume Mycolog as needed, current skin integrity appears to be good  Based on the cloudiness today, he may have another urinary tract infection possibly attributing to worsening incontinence.  We will send it for culture and treat as needed.  He did receive periprocedural Bactrim today to reduce infectious complications.  I did have a frank discussion today with the daughter regarding his incontinence.  He is having progressive dementia and does not seem to be bothered by his incontinence.  As such, very hesitant to recommend any treatment or medication for this is likely exacerbate his confusion if anticholinergics are used or possibly put them into urinary retention with underlying BPH.  The let us know if his symptoms worsen.  2. Bladder cancer  Cysto is unremarkable No evidence of disease Patient is high risk and given its been over 2 year since recurrence I discussed standard of care would be every 6 month cystoscopies.   Hollice Espy, MD

## 2020-08-29 ENCOUNTER — Telehealth: Payer: Self-pay | Admitting: *Deleted

## 2020-08-29 LAB — CULTURE, URINE COMPREHENSIVE

## 2020-08-29 MED ORDER — SULFAMETHOXAZOLE-TRIMETHOPRIM 800-160 MG PO TABS: 1.0000 | ORAL_TABLET | Freq: Two times a day (BID) | ORAL | 0 refills | Status: AC

## 2020-08-29 NOTE — Telephone Encounter (Addendum)
Spoke with son, Dean-informed, voiced understanding. RX sent to [facility]   ----- Message from Hollice Espy, MD sent at 08/29/2020  9:57 AM EST ----- This culture was sent at the time of cystoscopy secondary to cloudy appearing urine.  It ultimately grew E. coli.  Given that his daughter-in-law mentioned worsening urinary incontinence, we will going treat this with Bactrim DS twice daily for 7 days.  I suspect that he is likely chronically colonized, please advise him/his family to let us know if they think that treating this infection and improved his incontinence.  If it does not, we will abstain from treating in the future.  Hollice Espy, MD

## 2020-08-29 NOTE — Telephone Encounter (Signed)
-----   Message from Hollice Espy, MD sent at 08/29/2020  9:57 AM EST ----- This culture was sent at the time of cystoscopy secondary to cloudy appearing urine.  It ultimately grew E. coli.  Given that his daughter-in-law mentioned worsening urinary incontinence, we will going treat this with Bactrim DS twice daily for 7 days.  I suspect that he is likely chronically colonized, please advise him/his family to let us know if they think that treating this infection and improved his incontinence.  If it does not, we will abstain from treating in the future.  Hollice Espy, MD

## 2020-09-03 NOTE — Telephone Encounter (Signed)
Patient's daughter in law called wanting to know the status of the urine culture, results were given. Notification that Jimmy Shepard was informed on 08/29/20 and RX was sent to [facility]. Also discussed per Dr. Cherrie Gauze note that the family to let us know if they think that treating this infection and improved his incontinence.  If it does not, we will abstain from treating in the future. She verbalized understanding

## 2020-12-29 ENCOUNTER — Emergency Department: Payer: Medicare Other

## 2020-12-29 ENCOUNTER — Emergency Department
Admission: EM | Admit: 2020-12-29 | Discharge: 2020-12-29 | Disposition: A | Discharge status: Home / Self Care | Payer: Medicare Other | Attending: Emergency Medicine | Admitting: Emergency Medicine

## 2020-12-29 ENCOUNTER — Other Ambulatory Visit: Payer: Self-pay

## 2020-12-29 DIAGNOSIS — Z7982 Long term (current) use of aspirin: Secondary | ICD-10-CM | POA: Insufficient documentation

## 2020-12-29 DIAGNOSIS — Y92002 Bathroom of unspecified non-institutional (private) residence single-family (private) house as the place of occurrence of the external cause: Secondary | ICD-10-CM | POA: Diagnosis not present

## 2020-12-29 DIAGNOSIS — I10 Essential (primary) hypertension: Secondary | ICD-10-CM | POA: Insufficient documentation

## 2020-12-29 DIAGNOSIS — S0101XA Laceration without foreign body of scalp, initial encounter: Secondary | ICD-10-CM

## 2020-12-29 DIAGNOSIS — Z7984 Long term (current) use of oral hypoglycemic drugs: Secondary | ICD-10-CM | POA: Diagnosis not present

## 2020-12-29 DIAGNOSIS — W1839XA Other fall on same level, initial encounter: Secondary | ICD-10-CM | POA: Insufficient documentation

## 2020-12-29 DIAGNOSIS — W19XXXA Unspecified fall, initial encounter: Secondary | ICD-10-CM

## 2020-12-29 DIAGNOSIS — E1169 Type 2 diabetes mellitus with other specified complication: Secondary | ICD-10-CM | POA: Diagnosis not present

## 2020-12-29 DIAGNOSIS — S0990XA Unspecified injury of head, initial encounter: Secondary | ICD-10-CM | POA: Diagnosis present

## 2020-12-29 DIAGNOSIS — Z79899 Other long term (current) drug therapy: Secondary | ICD-10-CM | POA: Diagnosis not present

## 2020-12-29 DIAGNOSIS — F0390 Unspecified dementia without behavioral disturbance: Secondary | ICD-10-CM | POA: Insufficient documentation

## 2020-12-29 DIAGNOSIS — Z87891 Personal history of nicotine dependence: Secondary | ICD-10-CM | POA: Insufficient documentation

## 2020-12-29 DIAGNOSIS — I2510 Atherosclerotic heart disease of native coronary artery without angina pectoris: Secondary | ICD-10-CM | POA: Diagnosis not present

## 2020-12-29 DIAGNOSIS — E785 Hyperlipidemia, unspecified: Secondary | ICD-10-CM | POA: Diagnosis not present

## 2020-12-29 MED ORDER — LIDOCAINE-EPINEPHRINE 2 %-1:100000 IJ SOLN
20.0000 mL | Freq: Once | INTRAMUSCULAR | Status: AC
  Administered 2020-12-29: 08:00:00 20 mL via INTRADERMAL
  Filled 2020-12-29: qty 1

## 2020-12-29 NOTE — ED Provider Notes (Signed)
[facility] Emergency Department Provider Note  ____________________________________________   Event Date/Time   First MD Initiated Contact with Patient 12/29/20 [phone]     (approximate)  I have reviewed the triage vital signs and the nursing notes.   HISTORY  Chief Complaint Fall    HPI Daiquan Resnik is a 87 y.o. male  here from memory care unit with laceration to head. Pt reportedly got up in the middle of the night to go to the bathroom and fell. Does not know why he fell.     Level 5 caveat invoked as remainder of history, ROS, and physical exam limited due to patient's dementia.    Past Medical History:  Diagnosis Date   Coronary artery disease    Dementia ([place])    Diabetes mellitus without complication ([place])    type 2   Hyperlipidemia    Hypertension    Renal disorder     There are no problems to display for this patient.   History reviewed. No pertinent surgical history.  Prior to Admission medications   Medication Sig Start Date End Date Taking? Authorizing Provider  Acetaminophen 325 MG CAPS Take by mouth. Patient not taking: Reported on 02/21/2020    [provider]  aspirin 81 MG chewable tablet Chew by mouth.    [provider]  Blood Glucose Monitoring Suppl (ASSURE PRO BLOOD GLUCOSE METER) DEVI Check blood sugar once daily as needed. DX: E11.9 Patient not taking: Reported on 02/21/2020 11/23/16   [provider]  calcitRIOL (ROCALTROL) 0.25 MCG capsule Take by mouth. 07/20/17   [provider]  donepezil (ARICEPT) 10 MG tablet Take by mouth. 07/06/16   [provider]  glipiZIDE (GLUCOTROL XL) 2.5 MG 24 hr tablet Take by mouth. 10/14/17   [provider]  isosorbide mononitrate (IMDUR) 30 MG 24 hr tablet Take by mouth. 03/13/13   [provider]  losartan (COZAAR) 25 MG tablet Take by mouth. 03/04/20 03/04/21  [provider]  metoprolol succinate (TOPROL-XL) 100 MG  24 hr tablet Take by mouth. 09/13/13   [provider]  NIFEdipine (ADALAT CC) 60 MG 24 hr tablet Take by mouth. 01/05/14   [provider]  simvastatin (ZOCOR) 40 MG tablet Take by mouth. 01/05/14   [provider]  sulfamethoxazole-trimethoprim (BACTRIM DS) 800-160 MG tablet Take 1 tablet by mouth 2 (two) times daily. 08/29/20   Hollice Espy, MD    Allergies Tetanus toxoids and Penicillins  No family history on file.  Social History Social History   Tobacco Use   Smoking status: Former    Years: 2.00    Pack years: 0.00    Types: Cigarettes   Smokeless tobacco: Never  Substance Use Topics   Alcohol use: Never   Drug use: Never    Review of Systems  Review of Systems  Constitutional:  Negative for chills and fever.  HENT:  Negative for sore throat.   Respiratory:  Negative for shortness of breath.   Cardiovascular:  Negative for chest pain.  Gastrointestinal:  Negative for abdominal pain.  Genitourinary:  Negative for flank pain.  Musculoskeletal:  Negative for neck pain.  Skin:  Positive for wound. Negative for rash.  Allergic/Immunologic: Negative for immunocompromised state.  Neurological:  Negative for weakness and numbness.  Hematological:  Does not bruise/bleed easily.    ____________________________________________  PHYSICAL EXAM:      VITAL SIGNS: ED Triage Vitals  Enc Vitals Group     BP 12/29/20  0640 (!) 190/77     Pulse Rate 12/29/20 0640 69     Resp 12/29/20 0640 18     Temp 12/29/20 0640 97.7 F (36.5 C)     Temp Source 12/29/20 0640 Oral     SpO2 12/29/20 0640 99 %     Weight 12/29/20 0638 170 lb (77.1 kg)     Height 12/29/20 0638 5\' 10"  (1.778 m)     Head Circumference --      Peak Flow --      Pain Score --      Pain Loc --      Pain Edu? --      Excl. in [place]? --      Physical Exam Vitals and nursing note reviewed.  Constitutional:      General: He is not in acute distress.    Appearance: He is  well-developed.  HENT:     Head: Normocephalic and atraumatic.     Comments: Approximately 1 cm, irregular, stellate laceration to the posterior scalp.  Minimal surrounding swelling without step-offs or deformity.  No significant active bleeding. Eyes:     Conjunctiva/sclera: Conjunctivae normal.  Cardiovascular:     Rate and Rhythm: Normal rate and regular rhythm.     Heart sounds: Normal heart sounds.  Pulmonary:     Effort: Pulmonary effort is normal. No respiratory distress.     Breath sounds: No wheezing.  Abdominal:     General: There is no distension.  Musculoskeletal:     Cervical back: Neck supple.  Skin:    General: Skin is warm.     Capillary Refill: Capillary refill takes less than 2 seconds.     Findings: No rash.  Neurological:     Mental Status: He is alert and oriented to person, place, and time.     Motor: No abnormal muscle tone.      ____________________________________________   LABS (all labs ordered are listed, but only abnormal results are displayed)  Labs Reviewed - No data to display  ____________________________________________  EKG:  ________________________________________  RADIOLOGY All imaging, including plain films, CT scans, and ultrasounds, independently reviewed by me, and interpretations confirmed via formal radiology reads.  ED MD interpretation:   CT head/C-spine: Atrophy, no acute abnormality,  Official radiology report(s): CT Head Wo Contrast  Result Date: 12/29/2020 CLINICAL DATA:  Unwitnessed fall this morning EXAM: CT HEAD WITHOUT CONTRAST CT CERVICAL SPINE WITHOUT CONTRAST TECHNIQUE: Multidetector CT imaging of the head and cervical spine was performed following the standard protocol without intravenous contrast. Multiplanar CT image reconstructions of the cervical spine were also generated. COMPARISON:  07/22/2020 FINDINGS: CT HEAD FINDINGS Brain: Generalized atrophy. Normal ventricular morphology. No midline shift or mass  effect. Small vessel chronic ischemic changes of deep cerebral white matter. No intracranial hemorrhage, mass lesion, evidence of acute infarction, or extra-axial fluid collection. Vascular: Atherosclerotic calcification of internal carotid and vertebral arteries at skull base Skull: Intact Sinuses/Orbits: Clear Other: N/A CT CERVICAL SPINE FINDINGS Alignment: Normal Skull base and vertebrae: Diffuse osseous demineralization. Few scattered motion artifacts. Vertebral body heights maintained. Scattered mild disc space narrowing and endplate spur formation. Multilevel facet degenerative changes bilaterally. No definite fracture, subluxation, or bone destruction. Incomplete posterior arch C1, developmental anomaly. Soft tissues and spinal canal: Prevertebral soft tissues normal thickness. Extensive atherosclerotic calcifications at the carotid bifurcations and proximal great vessels as well as vertebral arteries at skull base. Disc levels:  No specific abnormalities. Upper chest: 8 mm nodular density LEFT apex.  Other: Small RIGHT occipital scalp lipoma 2.5 x 1.4 cm image 9. IMPRESSION: Atrophy with small vessel chronic ischemic changes of deep cerebral white matter. No acute intracranial abnormalities. Multilevel degenerative disc and facet disease changes of the cervical spine. No acute cervical spine abnormalities. 8 mm nodular density LEFT apex. Electronically Signed   By: Lavonia Dana M.D.   On: 12/29/2020 07:27   CT Cervical Spine Wo Contrast  Result Date: 12/29/2020 CLINICAL DATA:  Unwitnessed fall this morning EXAM: CT HEAD WITHOUT CONTRAST CT CERVICAL SPINE WITHOUT CONTRAST TECHNIQUE: Multidetector CT imaging of the head and cervical spine was performed following the standard protocol without intravenous contrast. Multiplanar CT image reconstructions of the cervical spine were also generated. COMPARISON:  07/22/2020 FINDINGS: CT HEAD FINDINGS Brain: Generalized atrophy. Normal ventricular morphology. No  midline shift or mass effect. Small vessel chronic ischemic changes of deep cerebral white matter. No intracranial hemorrhage, mass lesion, evidence of acute infarction, or extra-axial fluid collection. Vascular: Atherosclerotic calcification of internal carotid and vertebral arteries at skull base Skull: Intact Sinuses/Orbits: Clear Other: N/A CT CERVICAL SPINE FINDINGS Alignment: Normal Skull base and vertebrae: Diffuse osseous demineralization. Few scattered motion artifacts. Vertebral body heights maintained. Scattered mild disc space narrowing and endplate spur formation. Multilevel facet degenerative changes bilaterally. No definite fracture, subluxation, or bone destruction. Incomplete posterior arch C1, developmental anomaly. Soft tissues and spinal canal: Prevertebral soft tissues normal thickness. Extensive atherosclerotic calcifications at the carotid bifurcations and proximal great vessels as well as vertebral arteries at skull base. Disc levels:  No specific abnormalities. Upper chest: 8 mm nodular density LEFT apex. Other: Small RIGHT occipital scalp lipoma 2.5 x 1.4 cm image 9. IMPRESSION: Atrophy with small vessel chronic ischemic changes of deep cerebral white matter. No acute intracranial abnormalities. Multilevel degenerative disc and facet disease changes of the cervical spine. No acute cervical spine abnormalities. 8 mm nodular density LEFT apex. Electronically Signed   By: Lavonia Dana M.D.   On: 12/29/2020 07:27    ____________________________________________  PROCEDURES   Procedure(s) performed (including Critical Care):  Procedures  ____________________________________________  INITIAL IMPRESSION / MDM / [place] / ED COURSE  As part of my medical decision making, I reviewed the following data within the [place] [facility] notes reviewed and incorporated, Old chart reviewed, Notes from prior ED visits, and [place] Controlled Substance [place] was evaluated in Emergency Department on 12/29/2020 for the symptoms described in the history of present illness. He was evaluated in the context of the global COVID-19 pandemic, which necessitated consideration that the patient might be at risk for infection with the SARS-CoV-2 virus that causes COVID-19. Institutional protocols and algorithms that pertain to the evaluation of patients at risk for COVID-19 are in a state of rapid change based on information released by regulatory bodies including the CDC and federal and state organizations. These policies and algorithms were followed during the patient's care in the ED.  Some ED evaluations and interventions may be delayed as a result of limited staffing during the pandemic.*     Medical Decision Making: 87 year old male here with superficial laceration to the posterior scalp.  He is not on blood thinners.  He is at his mental baseline.  CT head and C-spine reviewed, showed no evidence of acute abnormality.  He is hemodynamically stable.  No apparent recent infectious symptoms or trigger for his fall.  Laceration repaired.  Patient is allergic to tetanus vaccine.  Wound was thoroughly cleansed, placed on antibiotic ointment and outpatient follow-up for staple removal.  ____________________________________________  FINAL CLINICAL IMPRESSION(S) / ED DIAGNOSES  Final diagnoses:  Laceration of scalp, initial encounter  Fall, initial encounter     MEDICATIONS GIVEN DURING THIS VISIT:  Medications  lidocaine-EPINEPHrine (XYLOCAINE W/EPI) 2 %-1:100000 (with pres) injection 20 mL (has no administration in time range)     ED Discharge Orders     None        Note:  This document was prepared using Dragon voice recognition software and may include unintentional dictation errors.   Duffy Bruce, MD 12/29/20 [phone]

## 2020-12-29 NOTE — ED Provider Notes (Signed)
Emergency Medicine Provider Triage Evaluation Note  Jimmy Shepard , a 87 y.o. male  was evaluated in triage.  Pt complains of pain in the back of the head after fall.  He comes from the memory care unit of the local facility.  He got up in the middle the night to go to the bathroom and said that he does not know why but he knows that he fell and hit the back of his head.  He does not have any pain in his neck.    Review of Systems  Positive: Pain in the back of the head with a laceration, bleeding controlled. Negative: Neck pain, chest pain, shortness of breath, abdominal pain, weakness in his extremities, pain in his extremities.  Physical Exam  BP (!) 190/77   Pulse 69   Temp 97.7 F (36.5 C) (Oral)   Resp 18   Ht 1.778 m (5\' 10" )   Wt 77.1 kg   SpO2 99%   BMI 24.39 kg/m  Gen:   Awake, no distress   Resp:  Normal effort  MSK:   Moves extremities without difficulty.  No tenderness to palpation of the cervical spine.  No obvious extremity injuries or deformities. Other:  Laceration to the back of the head, bleeding controlled.  Medical Decision Making  Medically screening exam initiated at 6:45 AM.  Appropriate orders placed.  Arif Amendola was informed that the remainder of the evaluation will be completed by another provider, this initial triage assessment does not replace that evaluation, and the importance of remaining in the ED until their evaluation is complete.  Ordered CT head and CT cervical spine.  Vital signs stable other than hypertension.  Patient has dementia and is from a memory care unit but he is in no distress currently.  His son is being called at his request.   Hinda Kehr, MD 12/29/20 [phone]

## 2020-12-29 NOTE — ED Triage Notes (Addendum)
Pt to ED via EMS from [facility], pt had an unwitnessed fall this morning. Pt arrives with lac to back of head, bleeding controlled at this time. Pt states he was getting up to use bathroom and lost his balance. Denies LOC. Pt has hx dementia, at baseline. No known blood thinner usse

## 2020-12-29 NOTE — ED Notes (Signed)
Called [facility] for transport to [facility]

## 2020-12-29 NOTE — ED Notes (Signed)
Pt unable to sign for discharge due to dementia.  EMS at bedside.

## 2020-12-29 NOTE — Discharge Instructions (Addendum)
Apply over-the-counter antibiotic ointment to the wound three times daily for 1 week

## 2020-12-29 NOTE — ED Notes (Signed)
Pt was found attempting to get OOB. Pt placed back in bed. Pt does not remember falling and does not know why is at hospital.

## 2020-12-29 NOTE — ED Notes (Signed)
Pt called on call light because he "wet the bed". Pt had been asked if he needed to void when this nurse was in room last time and pt had said did not need to urinate. Pt had been provided with urinal as well. Dry chux placed under pt and pt told will be going home in ambulance and will be cleaned up and have pants put on when EMS arrives. Urinal at bedside. Pt still attempting to get OOB and does not remember conversations with this nurse. Bed alarm is in place.

## 2020-12-29 NOTE — ED Notes (Signed)
Attempted to call son dean at this time with no answer

## 2020-12-29 NOTE — ED Notes (Signed)
Pts son Jimmy Shepard called back at this time and updated, son requesting update prior to pt d/c

## 2021-02-24 ENCOUNTER — Other Ambulatory Visit: Payer: Self-pay | Admitting: Urology

## 2021-06-18 DEATH — deceased

## 2022-04-19 IMAGING — CT CT CERVICAL SPINE W/O CM
3 of 5 series · 10 of 33 positions shown, 11 images · non-contrast
Comparison: 07/22/2020

CLINICAL DATA: Unwitnessed fall this morning

EXAM:
CT HEAD WITHOUT CONTRAST
CT CERVICAL SPINE WITHOUT CONTRAST
TECHNIQUE: Multidetector CT imaging of the head and cervical spine was
performed following the standard protocol without intravenous
contrast. Multiplanar CT image reconstructions of the cervical spine
were also generated.

[Series 6: sagittal bone · sagittal · 0.23mm/px · 5 of 57 slices shown]
[im 19/57  bone]
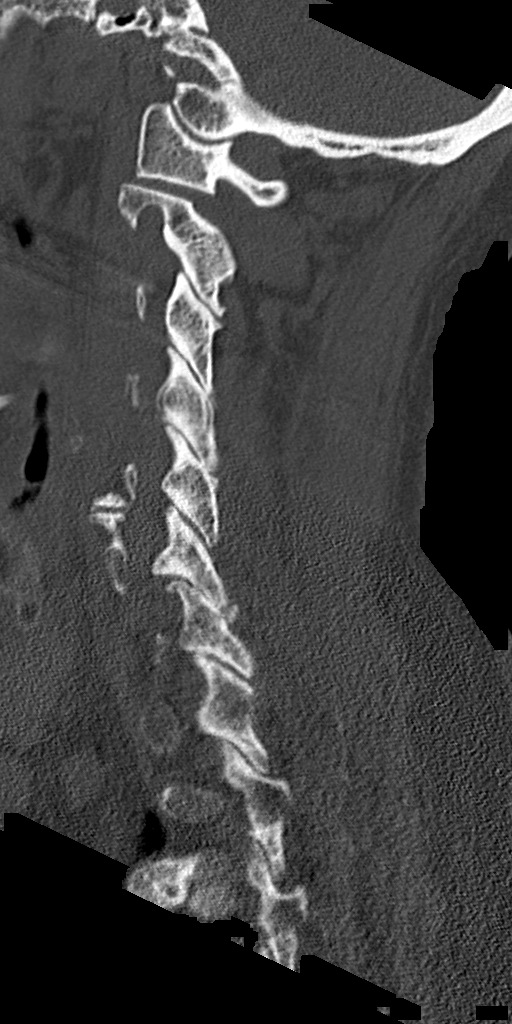
[im 24/57  bone]
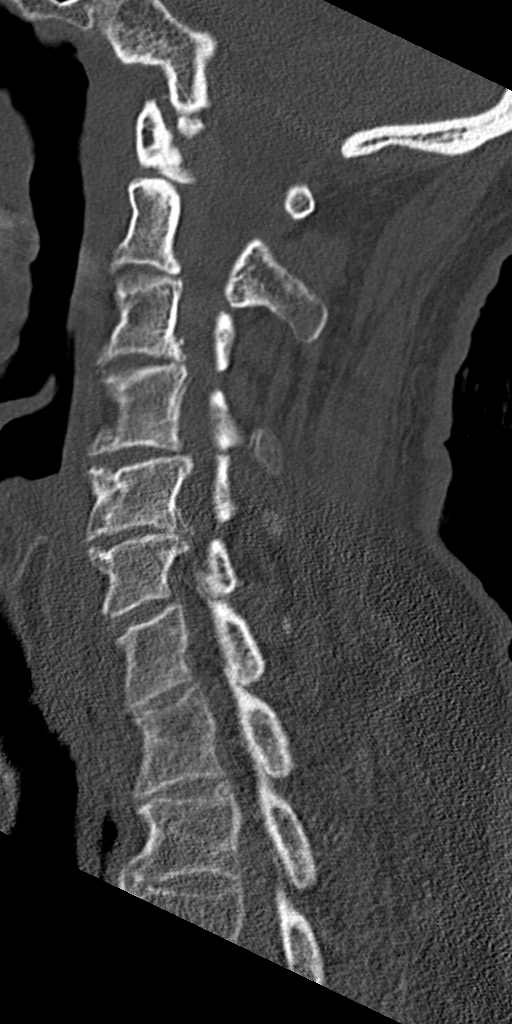
[im 29/57  bone]
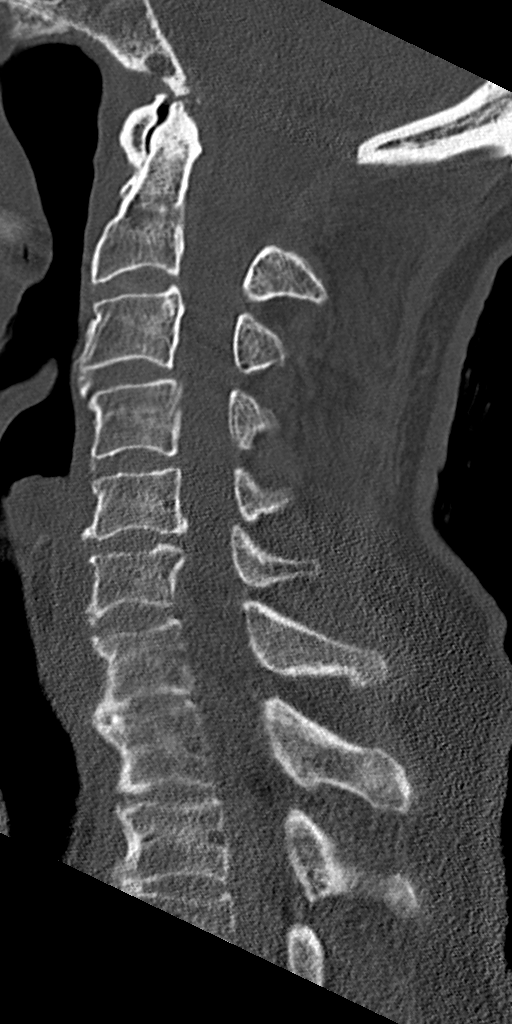
[im 33/57  bone]
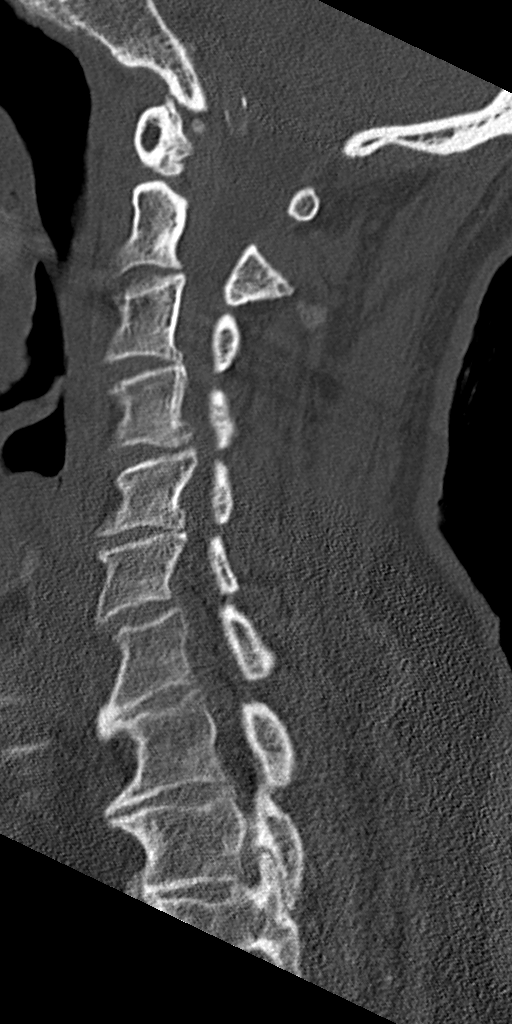
[im 38/57  bone]
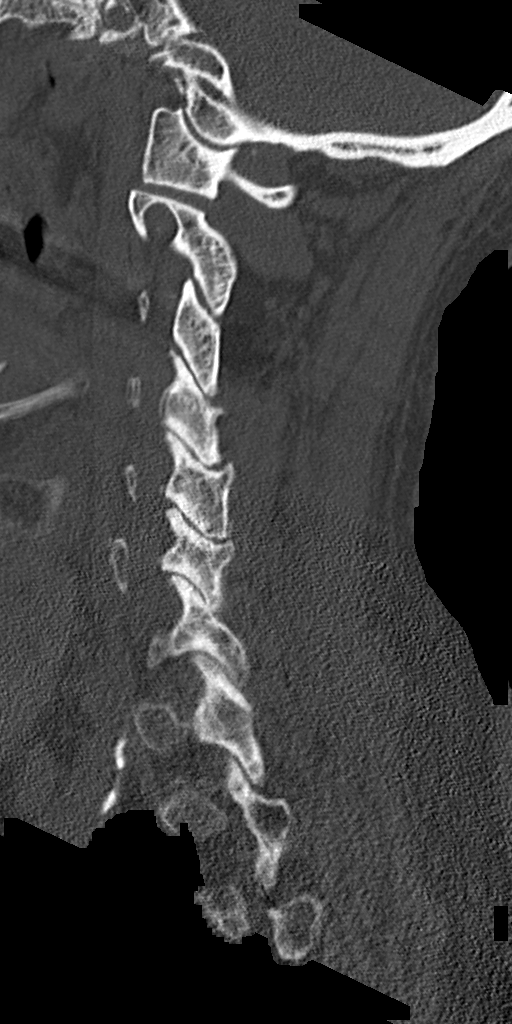

[Series 7: coronal bone · coronal · 0.22mm/px · 3 of 60 slices shown]
[im 13/60  bone]
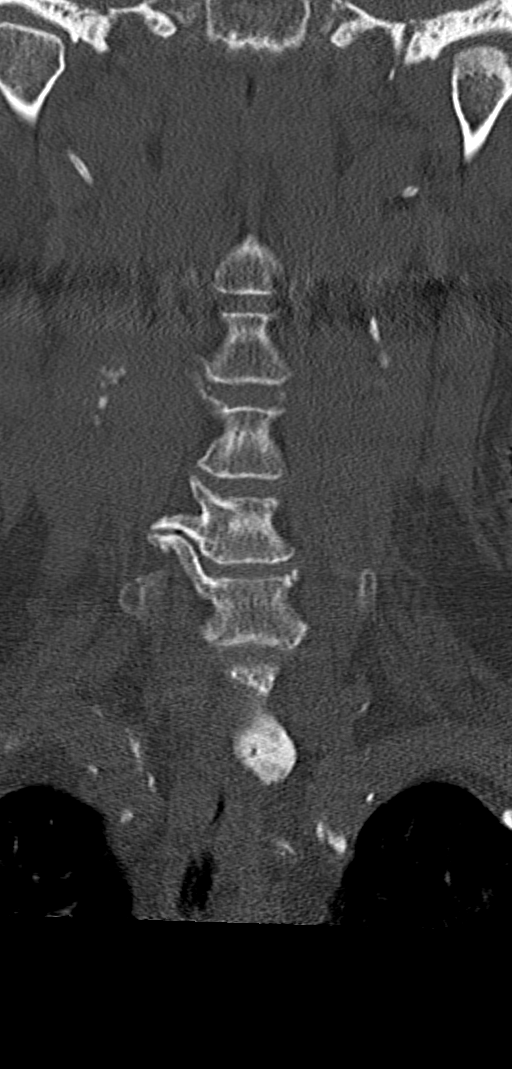
[im 24/60  bone]
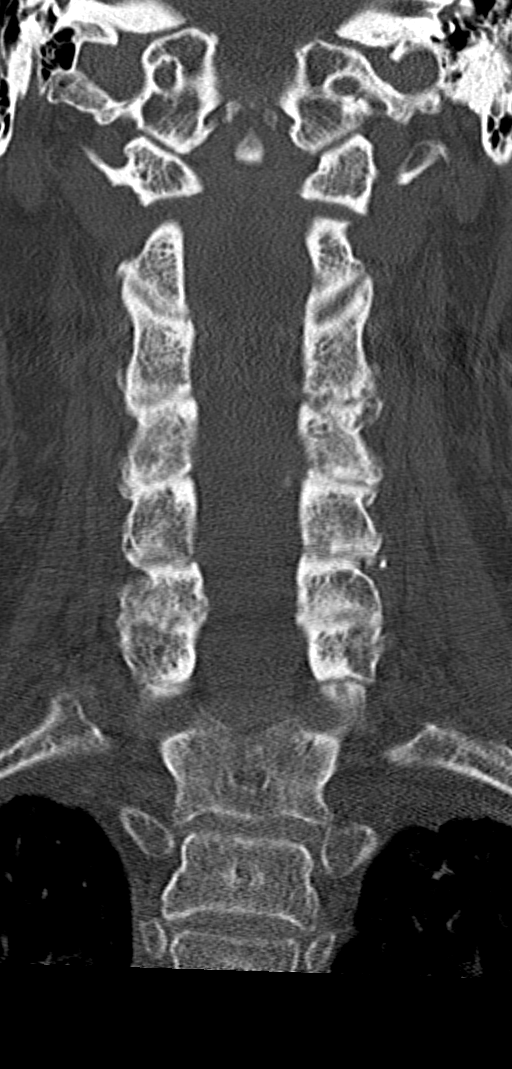
[im 36/60  bone]
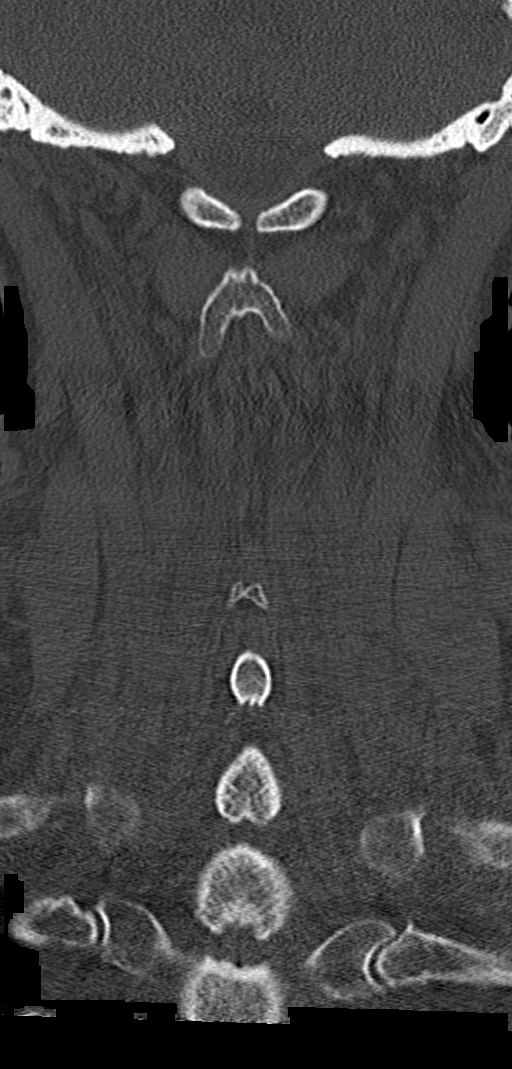

[Series 8: orthogonal bone · axial · 0.22mm/px · z∈[-302,-232]mm · 2 of 119 slices shown, 3 images]
[im 40/119  soft-tissue]
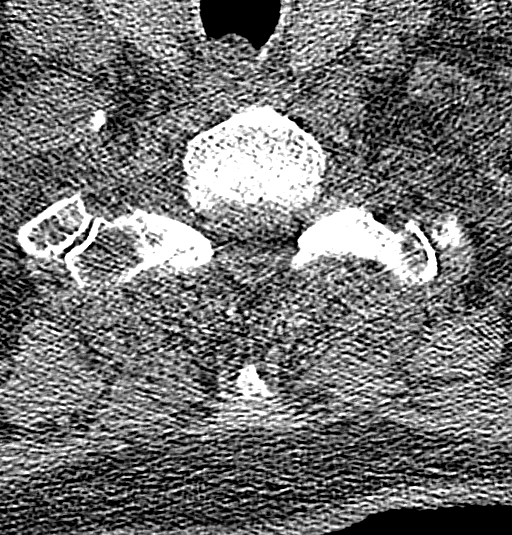
[im 40/119  bone]
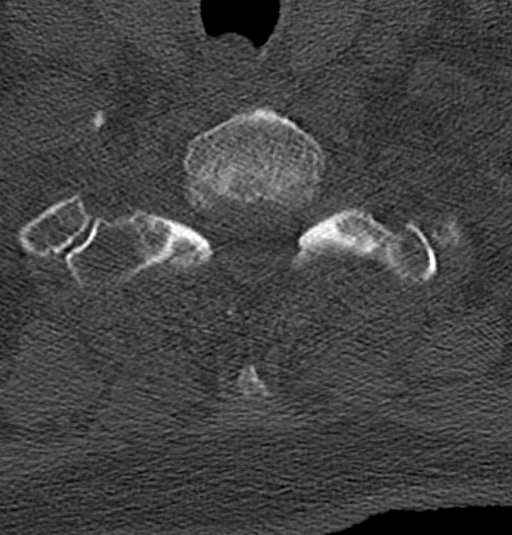
[im 79/119  bone]
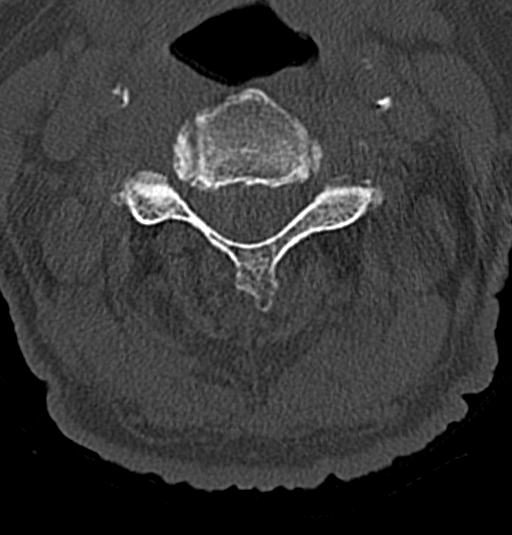

[10 of 33 positions shown; findings below may reference images not displayed]

FINDINGS: CT HEAD FINDINGS

Brain: Generalized atrophy. Normal ventricular morphology. No
midline shift or mass effect. Small vessel chronic ischemic changes
of deep cerebral white matter. No intracranial hemorrhage, mass
lesion, evidence of acute infarction, or extra-axial fluid
collection.

Vascular: Atherosclerotic calcification of internal carotid and
vertebral arteries at skull base

Skull: Intact

Sinuses/Orbits: Clear

Other: N/A

CT CERVICAL SPINE FINDINGS

Alignment: Normal

Skull base and vertebrae: Diffuse osseous demineralization. Few
scattered motion artifacts. Vertebral body heights maintained.
Scattered mild disc space narrowing and endplate spur formation.
Multilevel facet degenerative changes bilaterally. No definite
fracture, subluxation, or bone destruction. Incomplete posterior
arch C1, developmental anomaly.

Soft tissues and spinal canal: Prevertebral soft tissues normal
thickness. Extensive atherosclerotic calcifications at the carotid
bifurcations and proximal great vessels as well as vertebral
arteries at skull base.

Disc levels:  No specific abnormalities.

Upper chest: 8 mm nodular density LEFT apex.

Other: Small RIGHT occipital scalp lipoma 2.5 x 1.4 cm image 9.
IMPRESSION: Atrophy with small vessel chronic ischemic changes of deep cerebral
white matter.

No acute intracranial abnormalities.

Multilevel degenerative disc and facet disease changes of the
cervical spine.

No acute cervical spine abnormalities.

8 mm nodular density LEFT apex.

## 2022-04-19 IMAGING — CT CT HEAD W/O CM
4 series · 16 of 47 positions shown, 18 images · non-contrast
Comparison: 07/22/2020

CLINICAL DATA: Unwitnessed fall this morning

EXAM:
CT HEAD WITHOUT CONTRAST
CT CERVICAL SPINE WITHOUT CONTRAST
TECHNIQUE: Multidetector CT imaging of the head and cervical spine was
performed following the standard protocol without intravenous
contrast. Multiplanar CT image reconstructions of the cervical spine
were also generated.

[Series 2: head bone · axial · 0.47mm/px · z∈[-137,-105]mm · 3 of 80 slices shown]
[im 8/80  bone]
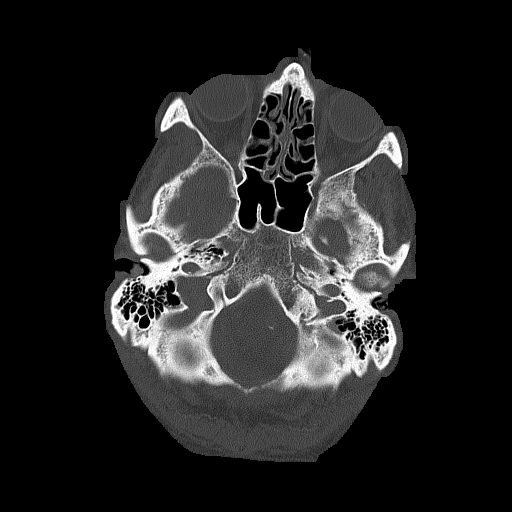
[im 16/80  bone]
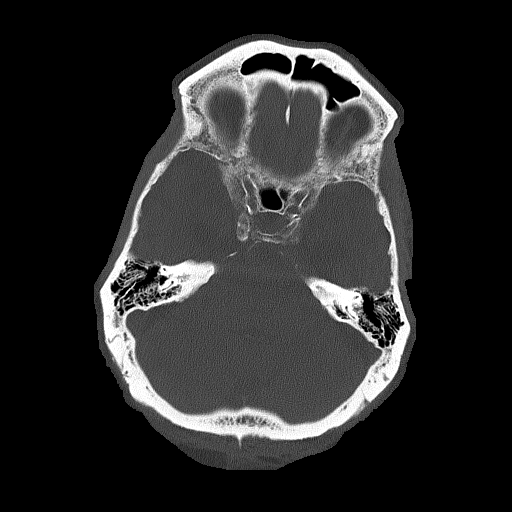
[im 24/80  bone]
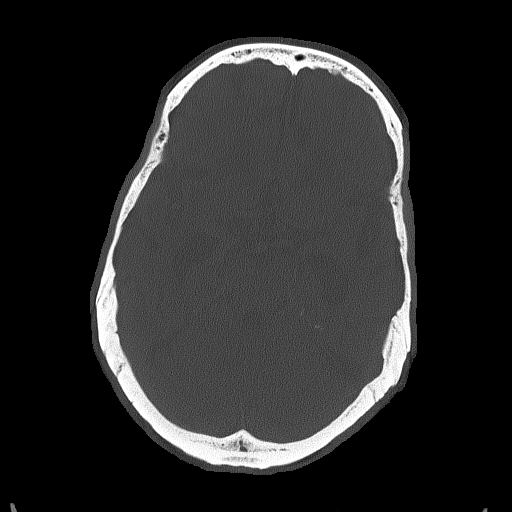

[Series 3: head wo · axial · 0.47mm/px · z∈[-136,-16]mm · 7 of 32 slices shown, 9 images]
[im 4/32  brain]
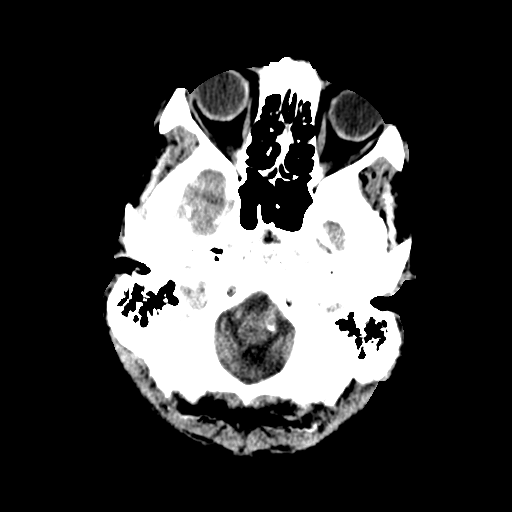
[im 4/32  bone]
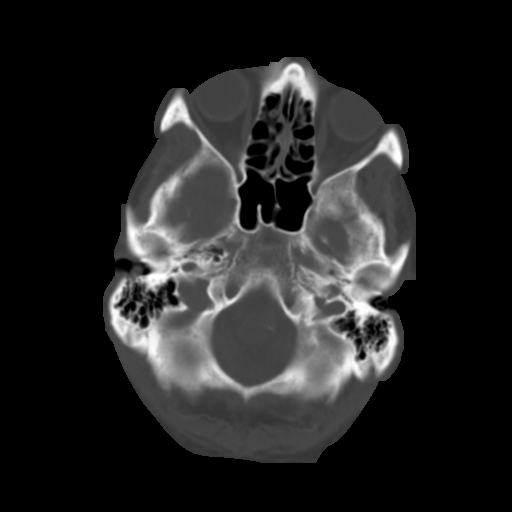
[im 8/32  brain]
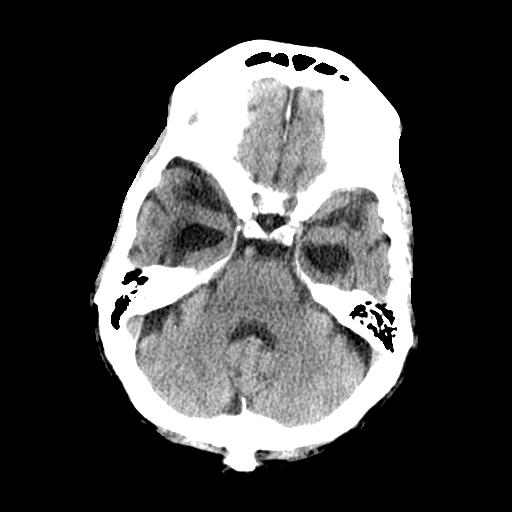
[im 12/32  brain]
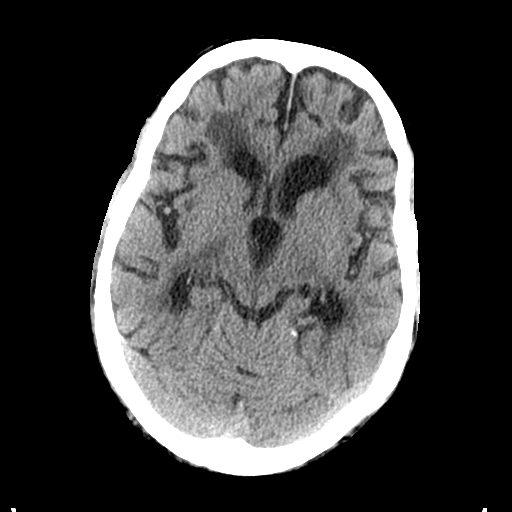
[im 16/32  brain]
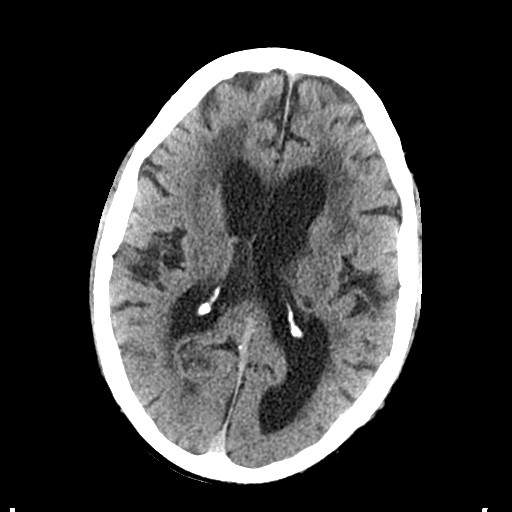
[im 20/32  brain]
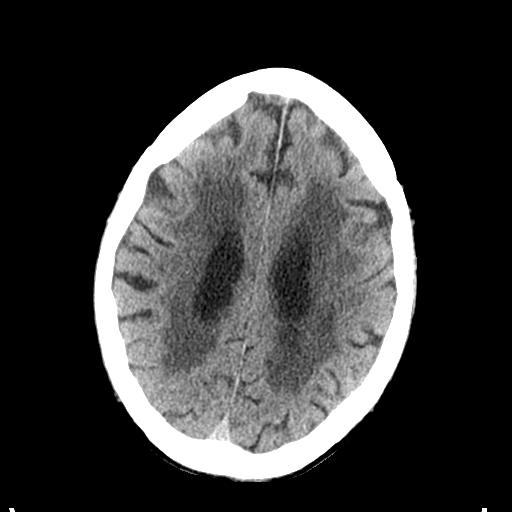
[im 20/32  bone]
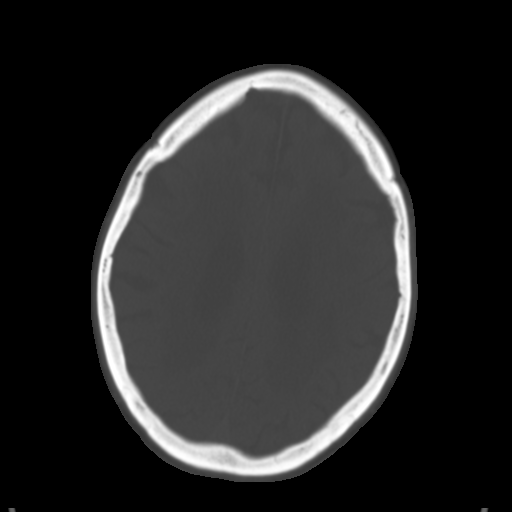
[im 24/32  brain]
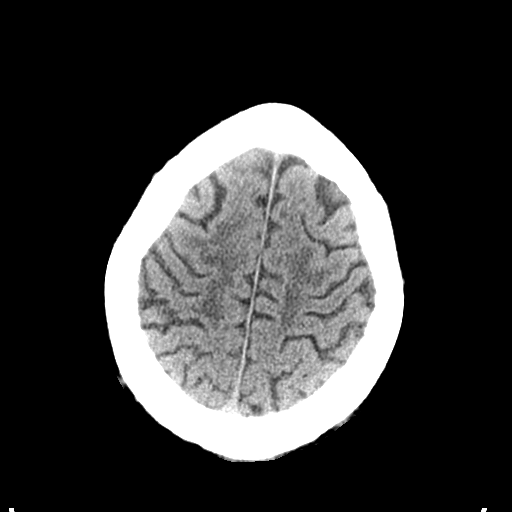
[im 28/32  brain]
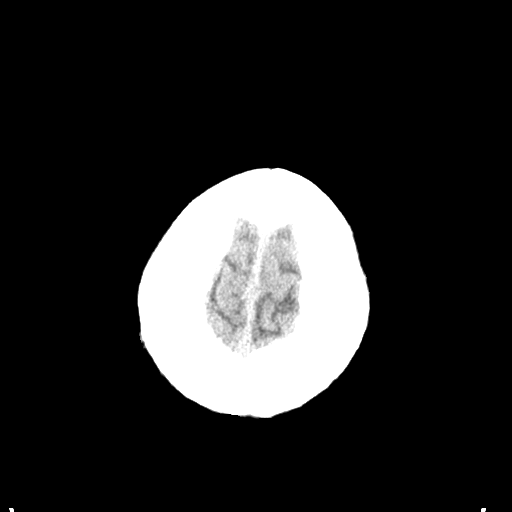

[Series 4: coronal soft tissue · coronal · 0.36mm/px · 3 of 73 slices shown]
[im 25/73  brain]
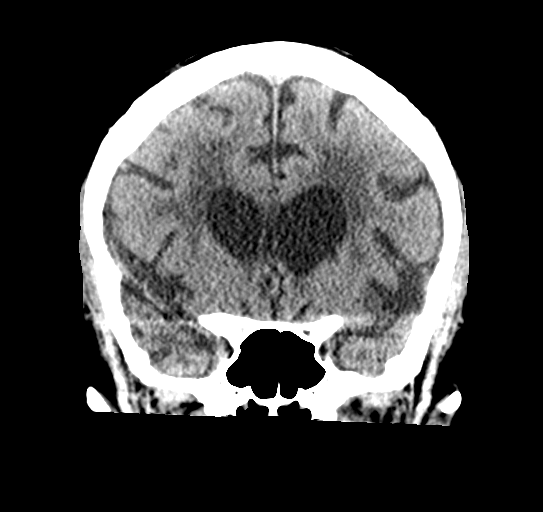
[im 33/73  brain]
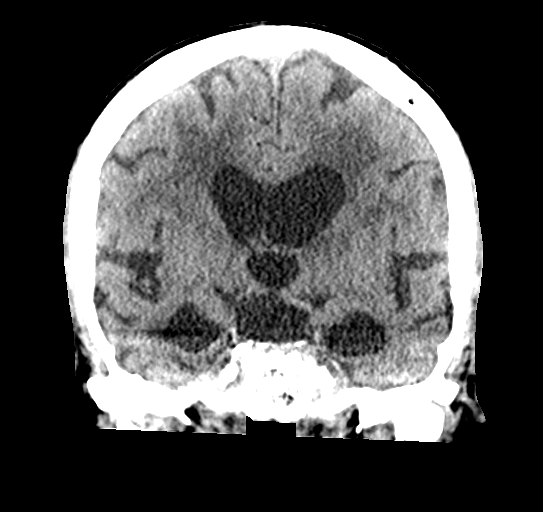
[im 41/73  brain]
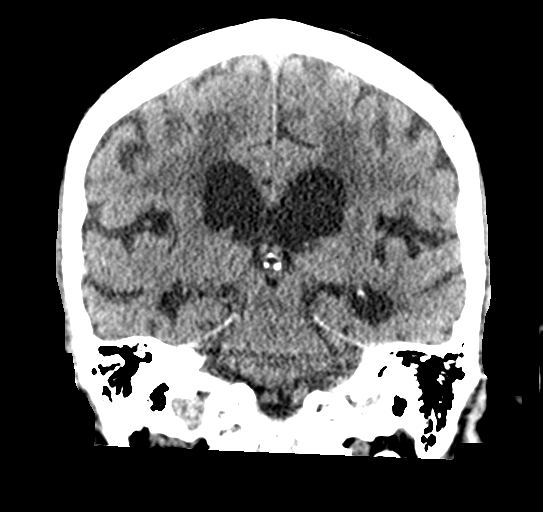

[Series 5: sagittal soft tissue · sagittal · 0.36mm/px · 3 of 59 slices shown]
[im 20/59  brain]
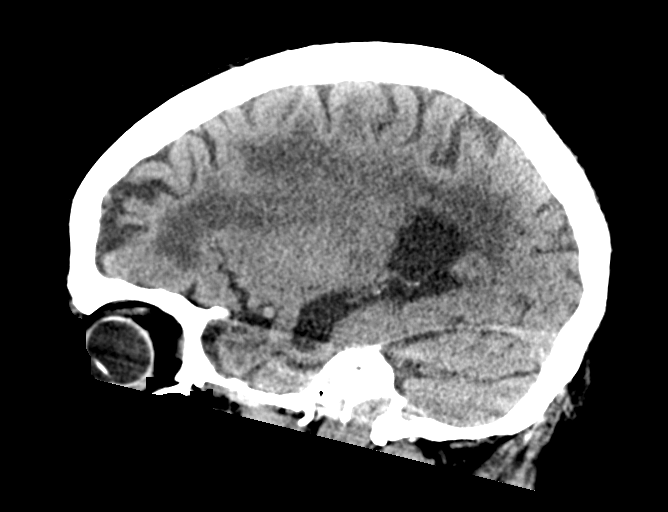
[im 30/59  brain]
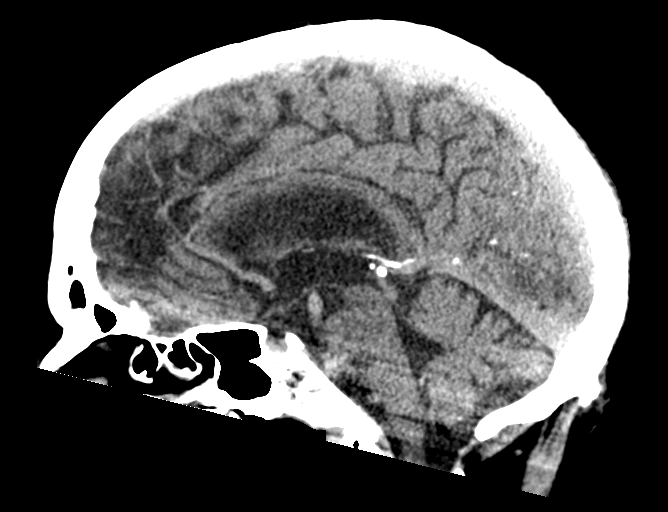
[im 39/59  brain]
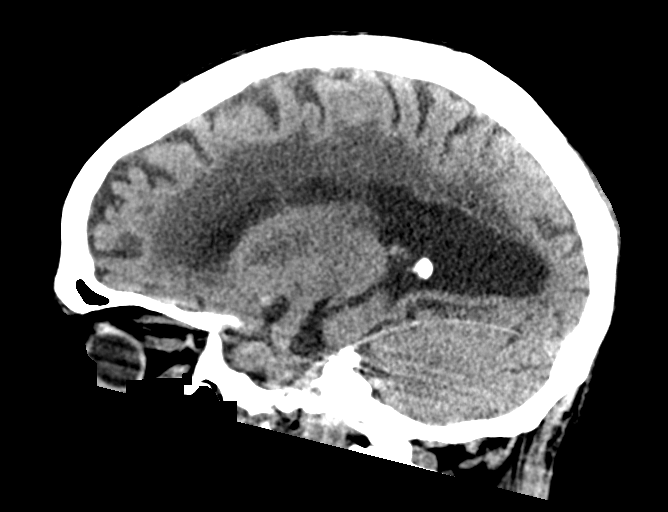

[16 of 47 positions shown; findings below may reference images not displayed]

FINDINGS: CT HEAD FINDINGS

Brain: Generalized atrophy. Normal ventricular morphology. No
midline shift or mass effect. Small vessel chronic ischemic changes
of deep cerebral white matter. No intracranial hemorrhage, mass
lesion, evidence of acute infarction, or extra-axial fluid
collection.

Vascular: Atherosclerotic calcification of internal carotid and
vertebral arteries at skull base

Skull: Intact

Sinuses/Orbits: Clear

Other: N/A

CT CERVICAL SPINE FINDINGS

Alignment: Normal

Skull base and vertebrae: Diffuse osseous demineralization. Few
scattered motion artifacts. Vertebral body heights maintained.
Scattered mild disc space narrowing and endplate spur formation.
Multilevel facet degenerative changes bilaterally. No definite
fracture, subluxation, or bone destruction. Incomplete posterior
arch C1, developmental anomaly.

Soft tissues and spinal canal: Prevertebral soft tissues normal
thickness. Extensive atherosclerotic calcifications at the carotid
bifurcations and proximal great vessels as well as vertebral
arteries at skull base.

Disc levels:  No specific abnormalities.

Upper chest: 8 mm nodular density LEFT apex.

Other: Small RIGHT occipital scalp lipoma 2.5 x 1.4 cm image 9.
IMPRESSION: Atrophy with small vessel chronic ischemic changes of deep cerebral
white matter.

No acute intracranial abnormalities.

Multilevel degenerative disc and facet disease changes of the
cervical spine.

No acute cervical spine abnormalities.

8 mm nodular density LEFT apex.
